# Patient Record
Sex: Male | Born: 1985 | Race: White | Hispanic: No | Marital: Married | Smoking: Former smoker
Health system: Southern US, Community
[De-identification: ages and names within clinical notes are randomized; demographics above are authoritative.]

## PROBLEM LIST (undated history)

## (undated) DIAGNOSIS — Q7649 Other congenital malformations of spine, not associated with scoliosis: Secondary | ICD-10-CM

## (undated) DIAGNOSIS — F1010 Alcohol abuse, uncomplicated: Secondary | ICD-10-CM

## (undated) DIAGNOSIS — F1910 Other psychoactive substance abuse, uncomplicated: Secondary | ICD-10-CM

## (undated) DIAGNOSIS — Z720 Tobacco use: Secondary | ICD-10-CM

## (undated) DIAGNOSIS — F419 Anxiety disorder, unspecified: Secondary | ICD-10-CM

## (undated) HISTORY — PX: BACK SURGERY: SHX140

---

## 2014-03-30 DIAGNOSIS — Q7649 Other congenital malformations of spine, not associated with scoliosis: Secondary | ICD-10-CM

## 2014-03-30 HISTORY — DX: Other congenital malformations of spine, not associated with scoliosis: Q76.49

## 2014-07-29 ENCOUNTER — Emergency Department (HOSPITAL_COMMUNITY): Payer: 59

## 2014-07-29 ENCOUNTER — Encounter (HOSPITAL_COMMUNITY): Payer: Self-pay

## 2014-07-29 ENCOUNTER — Emergency Department (HOSPITAL_COMMUNITY)
Admission: EM | Admit: 2014-07-29 | Discharge: 2014-07-29 | Disposition: A | Discharge status: Home / Self Care | Payer: 59 | Attending: Emergency Medicine | Admitting: Emergency Medicine

## 2014-07-29 DIAGNOSIS — Z720 Tobacco use: Secondary | ICD-10-CM | POA: Insufficient documentation

## 2014-07-29 DIAGNOSIS — Y939 Activity, unspecified: Secondary | ICD-10-CM | POA: Insufficient documentation

## 2014-07-29 DIAGNOSIS — F1210 Cannabis abuse, uncomplicated: Secondary | ICD-10-CM | POA: Insufficient documentation

## 2014-07-29 DIAGNOSIS — F1310 Sedative, hypnotic or anxiolytic abuse, uncomplicated: Secondary | ICD-10-CM | POA: Insufficient documentation

## 2014-07-29 DIAGNOSIS — F1110 Opioid abuse, uncomplicated: Secondary | ICD-10-CM | POA: Insufficient documentation

## 2014-07-29 DIAGNOSIS — Y998 Other external cause status: Secondary | ICD-10-CM | POA: Diagnosis not present

## 2014-07-29 DIAGNOSIS — Y929 Unspecified place or not applicable: Secondary | ICD-10-CM | POA: Diagnosis not present

## 2014-07-29 DIAGNOSIS — X58XXXA Exposure to other specified factors, initial encounter: Secondary | ICD-10-CM | POA: Insufficient documentation

## 2014-07-29 DIAGNOSIS — S299XXA Unspecified injury of thorax, initial encounter: Secondary | ICD-10-CM | POA: Diagnosis present

## 2014-07-29 DIAGNOSIS — S20212A Contusion of left front wall of thorax, initial encounter: Secondary | ICD-10-CM

## 2014-07-29 LAB — RAPID URINE DRUG SCREEN, HOSP PERFORMED
Amphetamines: NOT DETECTED
Barbiturates: NOT DETECTED
Benzodiazepines: POSITIVE — AB
Cocaine: NOT DETECTED
Opiates: POSITIVE — AB
TETRAHYDROCANNABINOL: POSITIVE — AB

## 2014-07-29 LAB — COMPREHENSIVE METABOLIC PANEL
ALBUMIN: 5.2 g/dL (ref 3.5–5.2)
ALT: 127 U/L — AB (ref 0–53)
AST: 329 U/L — ABNORMAL HIGH (ref 0–37)
Alkaline Phosphatase: 121 U/L — ABNORMAL HIGH (ref 39–117)
Anion gap: 14 (ref 5–15)
BUN: 9 mg/dL (ref 6–23)
CO2: 24 mmol/L (ref 19–32)
Calcium: 9.8 mg/dL (ref 8.4–10.5)
Chloride: 104 mmol/L (ref 96–112)
Creatinine, Ser: 1.05 mg/dL (ref 0.50–1.35)
GFR calc Af Amer: >90 mL/min (ref >90)
Glucose, Bld: 97 mg/dL (ref 70–99)
POTASSIUM: 4 mmol/L (ref 3.5–5.1)
Sodium: 142 mmol/L (ref 135–145)
TOTAL PROTEIN: 8.3 g/dL (ref 6.0–8.3)

## 2014-07-29 LAB — ETHANOL: ALCOHOL ETHYL (B): 163 mg/dL — AB (ref 0–9)

## 2014-07-29 LAB — I-STAT TROPONIN, ED: Troponin i, poc: 0 ng/mL (ref 0.00–0.08)

## 2014-07-29 MED ORDER — KETOROLAC TROMETHAMINE 15 MG/ML IJ SOLN
15.0000 mg | Freq: Once | INTRAMUSCULAR | Status: AC
  Administered 2014-07-29: 15 mg via INTRAVENOUS
  Filled 2014-07-29: qty 1

## 2014-07-29 MED ORDER — ACETAMINOPHEN 325 MG PO TABS
650.0000 mg | ORAL_TABLET | Freq: Once | ORAL | Status: DC
  Filled 2014-07-29: qty 2

## 2014-07-29 MED ORDER — ONDANSETRON 8 MG PO TBDP
8.0000 mg | ORAL_TABLET | Freq: Once | ORAL | Status: AC
  Administered 2014-07-29: 8 mg via ORAL
  Filled 2014-07-29: qty 1

## 2014-07-29 NOTE — ED Provider Notes (Signed)
CSN: [id number]     Arrival date & time 07/29/14  1423 History   First MD Initiated Contact with Patient 07/29/14 1501     Chief Complaint  Patient presents with  . Insect Bite  . Tachycardia     (Consider location/radiation/quality/duration/timing/severity/associated sxs/prior Treatment) HPI aWakened this morning with a bruised area over his left anteriolateral chest which she attributes to a spider bite.. He has no recall of being bitten by anything. He complains of pain over left anterior chest covering presently 5 cm diameter area. Fourth with changing position. Accompanying symptoms include nausea and blurred vision vision is improving steadily with time. He treated himself with ibuprofen and "a shot" of alcohol for the pain. No other associated symptoms. No fever. History reviewed. No pertinent past medical history. Past Surgical History  Procedure Laterality Date  . Back surgery     depression, anxiety History reviewed. No pertinent family history. History  Substance Use Topics  . Smoking status: Current Every Day Smoker -- 0.50 packs/day    Types: Cigarettes  . Smokeless tobacco: Not on file  . Alcohol Use: Yes    denies  illicitdrug use Review of Systems  Eyes: Positive for visual disturbance.  Cardiovascular: Positive for chest pain.  Gastrointestinal: Positive for nausea.  Skin: Positive for wound.  All other systems reviewed and are negative.     Allergies  Review of patient's allergies indicates not on file.  Home Medications   Prior to Admission medications   Not on File   BP 140/96 mmHg  Pulse 113  Temp(Src) 98 F (36.7 C) (Oral)  Resp 20  Ht  (1.778 m)  Wt 150 lb (68.04 kg)  BMI 21.52 kg/m2  SpO2 98% Physical Exam  Constitutional: He is oriented to person, place, and time. He appears well-developed and well-nourished.  HENT:  Head: Normocephalic and atraumatic.  Eyes: Conjunctivae are normal. Pupils are equal, round, and reactive to  light.  Neck: Neck supple. No tracheal deviation present. No thyromegaly present.  Cardiovascular: Regular rhythm.   No murmur heard. Mildly tachycardic  Pulmonary/Chest: Effort normal and breath sounds normal.  Is a 5 cm purplish ecchymosis at left anterolateral chest with corresponding tenderness. No mass  Abdominal: Soft. Bowel sounds are normal. He exhibits no distension. There is no tenderness.  Musculoskeletal: Normal range of motion. He exhibits no edema or tenderness.  Neurological: He is alert and oriented to person, place, and time. No cranial nerve deficit. Coordination normal.  Skin: Skin is warm and dry. No rash noted.  Psychiatric: He has a normal mood and affect.  Nursing note and vitals reviewed.   ED Course  Procedures (including critical care time) Labs Review Labs Reviewed - No data to display  Imaging Review No results found.   EKG Interpretation   Date/Time:  Thursday July 29 2014 14:47:23 EDT Ventricular Rate:  110 PR Interval:  167 QRS Duration: 95 QT Interval:  328 QTC Calculation: 444 R Axis:   59 Text Interpretation:  Sinus tachycardia no old Confirmed by KOHUT  MD,  STEPHEN ([id number]) on 07/29/2014 2:52:44 PM     5:05 PM Wound is unchanged Patient requesting more pain medicine. Tylenol ordered. Patients wife arrived. On interviewing her she states that he did not have bruises on chest upon going to bed last night. Results for orders placed or performed during the hospital encounter of 07/29/14  Ethanol  Result Value Ref Range   Alcohol, Ethyl (B) 163 (H) 0 - 9 mg/dL  Comprehensive metabolic panel  Result Value Ref Range   Sodium 142 135 - 145 mmol/L   Potassium 4.0 3.5 - 5.1 mmol/L   Chloride 104 96 - 112 mmol/L   CO2 24 19 - 32 mmol/L   Glucose, Bld 97 70 - 99 mg/dL   BUN 9 6 - 23 mg/dL   Creatinine, Ser 1.611.05 0.50 - 1.35 mg/dL   Calcium 9.8 8.4 - 09.610.5 mg/dL   Total Protein 8.3 6.0 - 8.3 g/dL   Albumin 5.2 3.5 - 5.2 g/dL   AST 045329 (H) 0 -  37 U/L   ALT 127 (H) 0 - 53 U/L   Alkaline Phosphatase 121 (H) 39 - 117 U/L   Total Bilirubin <0.1 (L) 0.3 - 1.2 mg/dL   GFR calc non Af Amer >90 >90 mL/min   GFR calc Af Amer >90 >90 mL/min   Anion gap 14 5 - 15  Drug screen panel, emergency  Result Value Ref Range   Opiates POSITIVE (A) NONE DETECTED   Cocaine NONE DETECTED NONE DETECTED   Benzodiazepines POSITIVE (A) NONE DETECTED   Amphetamines NONE DETECTED NONE DETECTED   Tetrahydrocannabinol POSITIVE (A) NONE DETECTED   Barbiturates NONE DETECTED NONE DETECTED  I-stat troponin, ED  Result Value Ref Range   Troponin i, poc 0.00 0.00 - 0.08 ng/mL   Comment 3           Dg Chest 2 View  07/29/2014   CLINICAL DATA:  Insect bite to the right side of the ribcage. Blurry vision started this morning. Ten out of 10 pain.  EXAM: CHEST  2 VIEW  COMPARISON:  None.  FINDINGS: The heart size and mediastinal contours are within normal limits. Both lungs are clear. The visualized skeletal structures are unremarkable.  IMPRESSION: No active cardiopulmonary disease.   Electronically Signed   By: Elige KoHetal  Patel   On: 07/29/2014 16:12    MDM  No signs of infection. Chest x-ray viewed by me Final diagnoses:  None   plan Tylenol for pain. Possibility of insect bite exists however unclear history   watch for signs of infection. Redness fever drainage Diagnosis#1 contusion chest wall #2 alcohol intoxication #3 substance abuse     Doug SouSam Bralynn Velador, MD 07/29/14 [phone]

## 2014-07-29 NOTE — Discharge Instructions (Signed)
Take Tylenol as needed for discomfort. Signs of infection are redness drainage from the wound and warmth at the site of the wound and fever. You have no evidence of infection today. Return or see an urgent care center if you feel worse. Your blood alcohol today is 2 times the legal limit. You also have several drugs in your system including opioids, benzodiazepines and marijuana. If you have a substance abuse problem get help, call any of the numbers on the resource guide. Call the [facility]  and wellness Center to get a primary care physician.  Emergency Department Resource Guide 1) Find a Doctor and Pay Out of Pocket Although you won't have to find out who is covered by your insurance plan, it is a good idea to ask around and get recommendations. You will then need to call the office and see if the doctor you have chosen will accept you as a new patient and what types of options they offer for patients who are self-pay. Some doctors offer discounts or will set up payment plans for their patients who do not have insurance, but you will need to ask so you aren't surprised when you get to your appointment.  2) Contact Your Local Health Department Not all health departments have doctors that can see patients for sick visits, but many do, so it is worth a call to see if yours does. If you don't know where your local health department is, you can check in your phone book. The CDC also has a tool to help you locate your state's health department, and many state websites also have listings of all of their local health departments.  3) Find a Walk-in Clinic If your illness is not likely to be very severe or complicated, you may want to try a walk in clinic. These are popping up all over the country in pharmacies, drugstores, and shopping centers. They're usually staffed by nurse practitioners or physician assistants that have been trained to treat common illnesses and complaints. They're usually fairly quick  and inexpensive. However, if you have serious medical issues or chronic medical problems, these are probably not your best option.  No Primary Care Doctor: - Call Health Connect at  [phone] - they can help you locate a primary care doctor that  accepts your insurance, provides certain services, etc. - Physician Referral Service- [phone]  Chronic Pain Problems: Organization         Address  Phone   Notes  [facility]  [phone] Patients need to be referred by their primary care doctor.   Medication Assistance: Organization         Address  Phone   Notes  [facility] Medication [facility] [address] [place] [place], [place] [address] [phone] --Must be a resident of [facility] -- Must have NO insurance coverage whatsoever (no Medicaid/ Medicare, etc.) -- The pt. MUST have a primary care doctor that directs their care regularly and follows them in the community   [facility]  [phone]   [employer]  [phone]    Agencies that provide inexpensive medical care: Organization         Address  Phone   Notes  [facility]  [phone]   [facility]    [phone]   [facility] [address] [place], [place] [address] [phone]   [facility] [address] [place] [place] (  [phone]   Planned Parenthood    [phone]   [facility]    [phone]   Community [facility] and [facility]  [address], [place] Phone:  [phone], Fax:  [phone] Hours of Operation:  9 am - 6 pm, M-F.  Also accepts Medicaid/Medicare and self-pay.  [facility]  [address], [place] Phone: [phone], Fax: [phone]. Hours of Operation:  8:30 am - 5:30 pm, M-F.  Also accepts Medicaid and self-pay.  [facility] [place] [address], [place] Phone: [phone]    [facility] [address] Natasha Bence [place], [place] [phone], Ext. [phone] Mondays & Thursdays: 7-9 AM.  First 15 patients are seen on a first come, first serve basis.    Medicaid-accepting [facility] Providers:  Organization         Address  Phone   Notes  [facility] [address], [place] [phone] Also accepts self-pay patients.  [facility] [address] Laurell Josephs [place], [place]  [phone]   [facility] [address], [place] [phone]   [facility] [address] [place] [phone]   Renaye Rakers [address], [place]   [phone] Only accepts [place] patients after they have their name applied to their card.   Self-Pay (no insurance) in [facility]:  Organization         Address  Phone   Notes  Sickle Cell Patients, [facility] Medicine [address] [place], [place] [phone]   [facility] [address] [place], [place] [phone]   [facility] [place]  [address], [place] [phone]   [facility]/Dr. Osei-Bonsu  [address] [place] or [address], [place] [phone] Phone number for both [place] and [place] locations is the same.  [facility] [address], [place] [phone]   [facility] [address] [place] or [address] [phone]   [facility] [address], [place] [phone], phone; [phone], fax Sees patients 1st and 3rd Saturday of every month.  Must not qualify for public or private insurance (i.e. Medicaid, Medicare, [place] Health Choice, Veterans' Benefits)  Household income should be no more than 200% of the poverty level The clinic cannot treat you if you are  pregnant or think you are pregnant  Sexually transmitted diseases are not treated at the clinic.    Dental Care: Organization         Address  Phone  Notes  [facility] Department of [facility] [address] [place], [place] [phone] Accepts children up to age 67 who are enrolled in [place] or [place] Health Choice; pregnant women with a Medicaid card; and children who have applied for Medicaid or [place] Health Choice, but were declined, whose parents can pay a reduced fee at time of service.  [facility] Department of [facility]  [address], [place] [phone] Accepts children up to age 11 who are enrolled in [place] or [place] Health Choice; pregnant women with a Medicaid card; and children who have applied for Medicaid or  Health  Choice, but were declined, whose parents can pay a reduced fee at time of service.  [facility]  [address] [place], [place] [phone] Patients are seen by appointment only. Walk-ins are not accepted. [facility] will see patients 68 years of age and older. Monday - Tuesday (8am-5pm) Most Wednesdays (8:30-5pm) $30 per visit, cash only  [facility] Adult [facility]  [address], [facility] [phone] Patients are seen by appointment only. Walk-ins are not accepted. [facility] will see patients 63 years of age and older. One Wednesday Evening (Monthly: Volunteer Based).  $30 per visit, cash only  [employer]  [phone] for adults; Children under age 51, call Graduate Pediatric Dentistry at [phone]. Children aged 76-14, please call [phone] to request a pediatric application.  Dental services are provided in all areas of dental care including fillings, crowns and bridges, complete and partial dentures, implants, gum treatment, root canals, and extractions. Preventive care is also provided. Treatment is provided to  both adults and children. Patients are selected via a lottery and there is often a waiting list.   [facility] [address] [place]  [phone] [email or web address]   [facility] [address] [place], [place] [phone], Ext. [phone] Second and Fourth Thursday of each month, opens at 6:30 AM; Clinic ends at 9 AM.  Patients are seen on a first-come first-served basis, and a limited number are seen during each clinic.   [facility]  [address] [place], [place] [phone]   Eligibility Requirements You must have lived in [place], [place], or [place] counties for at least the last three months.   You cannot be eligible for state or federal sponsored [place], including [employer], [place], or [employer].   You generally cannot be eligible for healthcare insurance through your employer.    How to apply: Eligibility screenings are held every Tuesday and Wednesday afternoon from 1:00 pm until 4:00 pm. You do not need an appointment for the interview!  [facility] [address], [place], [place] [phone]   [facility] Health Department  [phone]   [facility] Health Department  [phone]   [facility] Health Department  [phone]    Behavioral Health Resources in the Community: Intensive Outpatient Programs Organization         Address  Phone  Notes  [facility] [address], [place], [place] [phone]   [facility] Outpatient [address], [place], [place] [phone]   [facility] [address], [place], [place]  [phone]   [facility] Mental Health [address]  [place], [place] [phone] or [phone]   Substance Abuse Resources Organization         Address  Phone  Notes  [facility] Drug Services  [phone]   [facility]  [phone]   [facility] [place]   [phone]   Floydene Flock  [phone]   Residential & Outpatient Substance Abuse Program  [phone]   Psychological Services Organization         Address  Phone  Notes  [facility]  [phone]   [facility]  [phone]   [facility] Mental Health [address] [place] [phone] or [phone]    Mobile Crisis Teams Organization         Address  Phone  Notes  [facility], Mobile Crisis Care Unit  [phone]   [facility]  [address] [place], [place] [phone]   [facility] [address] [place] [phone]    Self-Help/Support Groups Organization         Address  Phone             Notes  [facility] [place] - variety of support groups  [phone]- [phone] Call for more information  Narcotics Anonymous (NA), [facility] [address], [place]  2 meetings at this location   Statisticianesidential Treatment Programs Organization         Address  Phone  Notes  [facility] [address],    [place]  [phone]   [facility]  [address], [place] [phone], [place], [place] [phone]   [facility] [address], [place] [phone] Admissions: 8am-3pm M-F  [facility] [address]    [place], [place] [phone]   [facility] [address], [place], [place] [phone]   [facility] [address]  [place], [place] [phone]   [facility] - Intensive Outpatient [address], [place], [place] [phone]   [facility] ([facility] [address]  [place], [place] [phone] or [phone]   [facility] ([facility]) [address] [place], [place] [phone] Accepts Medicaid  [facility] [address]  [place] [phone] Substance Abuse/Addiction Treatment   [facility] Behavioral Health Resources Organization         Address  Phone  Notes  [facility]  [phone]) [phone]   Angie FavaJulie Brannon, PhD [address] [place], [place]   [phone]) [phone] or [phone]) [phone]   [facility]   [address] [place], [place] [phone]) [phone]   [facility] [address], [place], [place] [phone](336) [phone] Insurance/Medicaid/sponsorship through [facility]  Faith and Families [address]                                    [place], [place] [phone](336) [phone] Therapy/tele-psych/case  [facility] [address].   [place], [place] [phone](336) [phone]    Dr. Lolly MustacheArfeen  [phone](336) [phone]   [facility] [place]  [employer] [facility] 1) [address], [place] 2) [address], [place] 3)  [address], [place] [phone]) [phone]) [phone]  [phone](336) [phone]   [facility] Child Abuse Hotline [phone](336) (715)[phone] or [phone]) [phone] (After Hours)

## 2014-07-29 NOTE — Progress Notes (Signed)
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 He states he has been awaiting pain medication  CM informed ED RN pt requesting pain medication

## 2014-07-29 NOTE — ED Notes (Signed)
Pt states that he needs an eye exam he use to wear glasses as a child and uses over the counter reading glasses to help him see

## 2014-07-29 NOTE — ED Notes (Signed)
Pt c/o insect bite to R side ribcage, nausea, and blurred vision starting this morning.  Pain score 10/10.  Swelling and bruising noted.  Pt reports that he woke up w/ the bite.

## 2014-09-01 ENCOUNTER — Emergency Department (HOSPITAL_COMMUNITY): Payer: 59

## 2014-09-01 ENCOUNTER — Encounter (HOSPITAL_COMMUNITY)
Admission: EM | Disposition: A | Discharge status: Home / Self Care | Payer: Self-pay | Source: Home / Self Care | Attending: Internal Medicine

## 2014-09-01 ENCOUNTER — Encounter (HOSPITAL_COMMUNITY): Payer: Self-pay

## 2014-09-01 ENCOUNTER — Inpatient Hospital Stay (HOSPITAL_COMMUNITY)
Admission: EM | Admit: 2014-09-01 | Discharge: 2014-09-04 | DRG: 378 | Disposition: A | Discharge status: Home / Self Care | Payer: 59 | Attending: Internal Medicine | Admitting: Internal Medicine

## 2014-09-01 DIAGNOSIS — K920 Hematemesis: Secondary | ICD-10-CM

## 2014-09-01 DIAGNOSIS — M5440 Lumbago with sciatica, unspecified side: Secondary | ICD-10-CM | POA: Diagnosis not present

## 2014-09-01 DIAGNOSIS — M549 Dorsalgia, unspecified: Secondary | ICD-10-CM | POA: Diagnosis present

## 2014-09-01 DIAGNOSIS — I959 Hypotension, unspecified: Secondary | ICD-10-CM | POA: Diagnosis present

## 2014-09-01 DIAGNOSIS — F1910 Other psychoactive substance abuse, uncomplicated: Secondary | ICD-10-CM | POA: Diagnosis not present

## 2014-09-01 DIAGNOSIS — Z79899 Other long term (current) drug therapy: Secondary | ICD-10-CM | POA: Diagnosis not present

## 2014-09-01 DIAGNOSIS — R609 Edema, unspecified: Secondary | ICD-10-CM | POA: Diagnosis present

## 2014-09-01 DIAGNOSIS — F17210 Nicotine dependence, cigarettes, uncomplicated: Secondary | ICD-10-CM | POA: Diagnosis present

## 2014-09-01 DIAGNOSIS — Z791 Long term (current) use of non-steroidal anti-inflammatories (NSAID): Secondary | ICD-10-CM | POA: Diagnosis not present

## 2014-09-01 DIAGNOSIS — F1193 Opioid use, unspecified with withdrawal: Secondary | ICD-10-CM

## 2014-09-01 DIAGNOSIS — K922 Gastrointestinal hemorrhage, unspecified: Secondary | ICD-10-CM | POA: Diagnosis present

## 2014-09-01 DIAGNOSIS — K254 Chronic or unspecified gastric ulcer with hemorrhage: Secondary | ICD-10-CM | POA: Diagnosis present

## 2014-09-01 DIAGNOSIS — D62 Acute posthemorrhagic anemia: Secondary | ICD-10-CM | POA: Diagnosis present

## 2014-09-01 DIAGNOSIS — F419 Anxiety disorder, unspecified: Secondary | ICD-10-CM | POA: Diagnosis present

## 2014-09-01 DIAGNOSIS — Z720 Tobacco use: Secondary | ICD-10-CM | POA: Diagnosis not present

## 2014-09-01 DIAGNOSIS — Z79891 Long term (current) use of opiate analgesic: Secondary | ICD-10-CM | POA: Diagnosis not present

## 2014-09-01 DIAGNOSIS — F1010 Alcohol abuse, uncomplicated: Secondary | ICD-10-CM | POA: Diagnosis present

## 2014-09-01 DIAGNOSIS — Z8249 Family history of ischemic heart disease and other diseases of the circulatory system: Secondary | ICD-10-CM

## 2014-09-01 DIAGNOSIS — F1123 Opioid dependence with withdrawal: Secondary | ICD-10-CM

## 2014-09-01 DIAGNOSIS — G8929 Other chronic pain: Secondary | ICD-10-CM | POA: Diagnosis present

## 2014-09-01 HISTORY — PX: ESOPHAGOGASTRODUODENOSCOPY: SHX5428

## 2014-09-01 HISTORY — DX: Other congenital malformations of spine, not associated with scoliosis: Q76.49

## 2014-09-01 HISTORY — DX: Other psychoactive substance abuse, uncomplicated: F19.10

## 2014-09-01 HISTORY — DX: Anxiety disorder, unspecified: F41.9

## 2014-09-01 HISTORY — DX: Alcohol abuse, uncomplicated: F10.10

## 2014-09-01 HISTORY — DX: Tobacco use: Z72.0

## 2014-09-01 LAB — I-STAT CHEM 8, ED
BUN: 42 mg/dL — ABNORMAL HIGH (ref 6–20)
CALCIUM ION: 1.22 mmol/L (ref 1.12–1.23)
CHLORIDE: 107 mmol/L (ref 101–111)
Creatinine, Ser: 1.1 mg/dL (ref 0.61–1.24)
Glucose, Bld: 193 mg/dL — ABNORMAL HIGH (ref 70–99)
HEMATOCRIT: 38 % — AB (ref 39.0–52.0)
Hemoglobin: 12.9 g/dL — ABNORMAL LOW (ref 13.0–17.0)
Potassium: 4.6 mmol/L (ref 3.5–5.1)
SODIUM: 141 mmol/L (ref 135–145)
TCO2: 19 mmol/L (ref 0–100)

## 2014-09-01 LAB — CBC
HCT: 24.8 % — ABNORMAL LOW (ref 39.0–52.0)
HCT: 24.9 % — ABNORMAL LOW (ref 39.0–52.0)
HEMATOCRIT: 30.5 % — AB (ref 39.0–52.0)
HEMATOCRIT: 29.3 % — AB (ref 39.0–52.0)
HEMOGLOBIN: 10.1 g/dL — AB (ref 13.0–17.0)
HEMOGLOBIN: 8.3 g/dL — AB (ref 13.0–17.0)
HEMOGLOBIN: 8.2 g/dL — AB (ref 13.0–17.0)
Hemoglobin: 9.9 g/dL — ABNORMAL LOW (ref 13.0–17.0)
MCH: 34.5 pg — AB (ref 26.0–34.0)
MCH: 35.1 pg — ABNORMAL HIGH (ref 26.0–34.0)
MCH: 34.9 pg — AB (ref 26.0–34.0)
MCH: 34.5 pg — ABNORMAL HIGH (ref 26.0–34.0)
MCHC: 33.1 g/dL (ref 30.0–36.0)
MCHC: 33.8 g/dL (ref 30.0–36.0)
MCHC: 33.5 g/dL (ref 30.0–36.0)
MCHC: 32.9 g/dL (ref 30.0–36.0)
MCV: 104.1 fL — AB (ref 78.0–100.0)
MCV: 103.9 fL — ABNORMAL HIGH (ref 78.0–100.0)
MCV: 104.2 fL — AB (ref 78.0–100.0)
MCV: 104.6 fL — ABNORMAL HIGH (ref 78.0–100.0)
PLATELETS: 144 10*3/uL — AB (ref 150–400)
Platelets: 184 10*3/uL (ref 150–400)
Platelets: 157 10*3/uL (ref 150–400)
Platelets: 133 10*3/uL — ABNORMAL LOW (ref 150–400)
RBC: 2.93 MIL/uL — ABNORMAL LOW (ref 4.22–5.81)
RBC: 2.82 MIL/uL — AB (ref 4.22–5.81)
RBC: 2.38 MIL/uL — AB (ref 4.22–5.81)
RBC: 2.38 MIL/uL — AB (ref 4.22–5.81)
RDW: 11.4 % — ABNORMAL LOW (ref 11.5–15.5)
RDW: 11.5 % (ref 11.5–15.5)
RDW: 11.6 % (ref 11.5–15.5)
RDW: 11.5 % (ref 11.5–15.5)
WBC: 7.9 10*3/uL (ref 4.0–10.5)
WBC: 7.1 10*3/uL (ref 4.0–10.5)
WBC: 4.8 10*3/uL (ref 4.0–10.5)
WBC: 5 10*3/uL (ref 4.0–10.5)

## 2014-09-01 LAB — CBC WITH DIFFERENTIAL/PLATELET
BASOS PCT: 0 % (ref 0–1)
Basophils Absolute: 0 10*3/uL (ref 0.0–0.1)
Eosinophils Absolute: 0 10*3/uL (ref 0.0–0.7)
Eosinophils Relative: 0 % (ref 0–5)
HEMATOCRIT: 36 % — AB (ref 39.0–52.0)
HEMOGLOBIN: 12.1 g/dL — AB (ref 13.0–17.0)
LYMPHS PCT: 14 % (ref 12–46)
Lymphs Abs: 1.4 10*3/uL (ref 0.7–4.0)
MCH: 35.1 pg — ABNORMAL HIGH (ref 26.0–34.0)
MCHC: 33.6 g/dL (ref 30.0–36.0)
MCV: 104.3 fL — ABNORMAL HIGH (ref 78.0–100.0)
Monocytes Absolute: 0.8 10*3/uL (ref 0.1–1.0)
Monocytes Relative: 8 % (ref 3–12)
NEUTROS PCT: 78 % — AB (ref 43–77)
Neutro Abs: 8 10*3/uL — ABNORMAL HIGH (ref 1.7–7.7)
PLATELETS: 250 10*3/uL (ref 150–400)
RBC: 3.45 MIL/uL — ABNORMAL LOW (ref 4.22–5.81)
RDW: 11.6 % (ref 11.5–15.5)
WBC: 10.2 10*3/uL (ref 4.0–10.5)

## 2014-09-01 LAB — COMPREHENSIVE METABOLIC PANEL
ALBUMIN: 3.7 g/dL (ref 3.5–5.0)
ALT: 52 U/L (ref 17–63)
ALT: 43 U/L (ref 17–63)
AST: 62 U/L — ABNORMAL HIGH (ref 15–41)
AST: 48 U/L — AB (ref 15–41)
Albumin: 4.3 g/dL (ref 3.5–5.0)
Alkaline Phosphatase: 70 U/L (ref 38–126)
Alkaline Phosphatase: 53 U/L (ref 38–126)
Anion gap: 7 (ref 5–15)
Anion gap: 5 (ref 5–15)
BUN: 45 mg/dL — ABNORMAL HIGH (ref 6–20)
BUN: 41 mg/dL — AB (ref 6–20)
CALCIUM: 9.1 mg/dL (ref 8.9–10.3)
CHLORIDE: 116 mmol/L — AB (ref 101–111)
CO2: 23 mmol/L (ref 22–32)
CO2: 21 mmol/L — AB (ref 22–32)
CREATININE: 1.12 mg/dL (ref 0.61–1.24)
Calcium: 7.9 mg/dL — ABNORMAL LOW (ref 8.9–10.3)
Chloride: 111 mmol/L (ref 101–111)
Creatinine, Ser: 0.83 mg/dL (ref 0.61–1.24)
GFR calc non Af Amer: >60 mL/min (ref >60)
GFR calc non Af Amer: >60 mL/min (ref >60)
Glucose, Bld: 200 mg/dL — ABNORMAL HIGH (ref 70–99)
Glucose, Bld: 132 mg/dL — ABNORMAL HIGH (ref 70–99)
POTASSIUM: 4.7 mmol/L (ref 3.5–5.1)
Potassium: 4.7 mmol/L (ref 3.5–5.1)
Sodium: 141 mmol/L (ref 135–145)
Sodium: 142 mmol/L (ref 135–145)
Total Bilirubin: 1.3 mg/dL — ABNORMAL HIGH (ref 0.3–1.2)
Total Bilirubin: 0.9 mg/dL (ref 0.3–1.2)
Total Protein: 6.6 g/dL (ref 6.5–8.1)
Total Protein: 5.5 g/dL — ABNORMAL LOW (ref 6.5–8.1)

## 2014-09-01 LAB — GLUCOSE, CAPILLARY: Glucose-Capillary: 123 mg/dL — ABNORMAL HIGH (ref 70–99)

## 2014-09-01 LAB — URINALYSIS, ROUTINE W REFLEX MICROSCOPIC
Bilirubin Urine: NEGATIVE
Glucose, UA: NEGATIVE mg/dL
Hgb urine dipstick: NEGATIVE
LEUKOCYTES UA: NEGATIVE
Nitrite: NEGATIVE
PROTEIN: 30 mg/dL — AB
Specific Gravity, Urine: 1.023 (ref 1.005–1.030)
Urobilinogen, UA: 0.2 mg/dL (ref 0.0–1.0)
pH: 6.5 (ref 5.0–8.0)

## 2014-09-01 LAB — ABO/RH: ABO/RH(D): O POS

## 2014-09-01 LAB — URINE MICROSCOPIC-ADD ON

## 2014-09-01 LAB — LIPASE, BLOOD: Lipase: 16 U/L — ABNORMAL LOW (ref 22–51)

## 2014-09-01 LAB — RAPID URINE DRUG SCREEN, HOSP PERFORMED
Amphetamines: NOT DETECTED
Barbiturates: NOT DETECTED
Benzodiazepines: POSITIVE — AB
Cocaine: NOT DETECTED
Opiates: POSITIVE — AB
Tetrahydrocannabinol: POSITIVE — AB

## 2014-09-01 LAB — I-STAT CG4 LACTIC ACID, ED: Lactic Acid, Venous: 2.87 mmol/L (ref 0.5–2.0)

## 2014-09-01 LAB — TROPONIN I
TROPONIN I: 0.06 ng/mL — AB (ref <0.031)
Troponin I: 0.07 ng/mL — ABNORMAL HIGH (ref <0.031)

## 2014-09-01 LAB — PREPARE RBC (CROSSMATCH)

## 2014-09-01 LAB — PROTIME-INR
INR: 1.07 (ref 0.00–1.49)
Prothrombin Time: 14 seconds (ref 11.6–15.2)

## 2014-09-01 LAB — MRSA PCR SCREENING: MRSA BY PCR: NEGATIVE

## 2014-09-01 LAB — HIV ANTIBODY (ROUTINE TESTING W REFLEX): HIV SCREEN 4TH GENERATION: NONREACTIVE

## 2014-09-01 LAB — LACTIC ACID, PLASMA: Lactic Acid, Venous: 0.6 mmol/L (ref 0.5–2.0)

## 2014-09-01 LAB — APTT: aPTT: 24 seconds (ref 24–37)

## 2014-09-01 LAB — ETHANOL: Alcohol, Ethyl (B): <5 mg/dL (ref <5)

## 2014-09-01 SURGERY — EGD (ESOPHAGOGASTRODUODENOSCOPY)
Anesthesia: Moderate Sedation

## 2014-09-01 MED ORDER — SODIUM CHLORIDE 0.9 % IJ SOLN
3.0000 mL | Freq: Two times a day (BID) | INTRAMUSCULAR | Status: DC
  Administered 2014-09-01 – 2014-09-02 (×3): 3 mL via INTRAVENOUS

## 2014-09-01 MED ORDER — SODIUM CHLORIDE 0.9 % IV SOLN
8.0000 mg/h | INTRAVENOUS | Status: DC
  Administered 2014-09-01 – 2014-09-03 (×5): 8 mg/h via INTRAVENOUS
  Filled 2014-09-01 (×12): qty 80

## 2014-09-01 MED ORDER — SODIUM CHLORIDE 0.9 % IJ SOLN
PREFILLED_SYRINGE | INTRAMUSCULAR | Status: DC | PRN
  Administered 2014-09-01: 2 mL

## 2014-09-01 MED ORDER — FOLIC ACID 1 MG PO TABS
1.0000 mg | ORAL_TABLET | Freq: Every day | ORAL | Status: DC
  Administered 2014-09-01 – 2014-09-04 (×4): 1 mg via ORAL
  Filled 2014-09-01 (×4): qty 1

## 2014-09-01 MED ORDER — SODIUM CHLORIDE 0.9 % IV SOLN
50.0000 ug/h | INTRAVENOUS | Status: DC
  Administered 2014-09-01 – 2014-09-02 (×4): 50 ug/h via INTRAVENOUS
  Filled 2014-09-01 (×8): qty 1

## 2014-09-01 MED ORDER — LORAZEPAM 2 MG/ML IJ SOLN
1.0000 mg | Freq: Four times a day (QID) | INTRAMUSCULAR | Status: AC | PRN
  Administered 2014-09-01 – 2014-09-03 (×4): 1 mg via INTRAVENOUS
  Filled 2014-09-01 (×5): qty 1

## 2014-09-01 MED ORDER — FENTANYL CITRATE (PF) 100 MCG/2ML IJ SOLN
INTRAMUSCULAR | Status: DC | PRN
  Administered 2014-09-01 (×4): 25 ug via INTRAVENOUS

## 2014-09-01 MED ORDER — PANTOPRAZOLE SODIUM 40 MG IV SOLR
40.0000 mg | Freq: Once | INTRAVENOUS | Status: AC
  Administered 2014-09-01: 40 mg via INTRAVENOUS
  Filled 2014-09-01: qty 40

## 2014-09-01 MED ORDER — SODIUM CHLORIDE 0.9 % IV BOLUS (SEPSIS)
2000.0000 mL | Freq: Once | INTRAVENOUS | Status: AC
  Administered 2014-09-01: 2000 mL via INTRAVENOUS

## 2014-09-01 MED ORDER — SODIUM CHLORIDE 0.9 % IV SOLN
INTRAVENOUS | Status: DC
  Administered 2014-09-01: 04:00:00 via INTRAVENOUS

## 2014-09-01 MED ORDER — LORAZEPAM 2 MG/ML IJ SOLN
0.5000 mg | Freq: Once | INTRAMUSCULAR | Status: AC
  Administered 2014-09-01: 0.5 mg via INTRAVENOUS
  Filled 2014-09-01: qty 1

## 2014-09-01 MED ORDER — ONDANSETRON HCL 4 MG/2ML IJ SOLN
4.0000 mg | Freq: Once | INTRAMUSCULAR | Status: AC
  Administered 2014-09-01: 4 mg via INTRAVENOUS
  Filled 2014-09-01: qty 2

## 2014-09-01 MED ORDER — DIPHENHYDRAMINE HCL 50 MG/ML IJ SOLN
INTRAMUSCULAR | Status: DC | PRN
  Administered 2014-09-01 (×2): 25 mg via INTRAVENOUS

## 2014-09-01 MED ORDER — VENLAFAXINE HCL ER 75 MG PO CP24
75.0000 mg | ORAL_CAPSULE | Freq: Every day | ORAL | Status: DC
  Administered 2014-09-02 – 2014-09-04 (×3): 75 mg via ORAL
  Filled 2014-09-01 (×5): qty 1

## 2014-09-01 MED ORDER — MIDAZOLAM HCL 10 MG/2ML IJ SOLN
INTRAMUSCULAR | Status: DC | PRN
  Administered 2014-09-01 (×5): 2 mg via INTRAVENOUS

## 2014-09-01 MED ORDER — SODIUM CHLORIDE 0.9 % IV SOLN: 10.0000 mL/h | Freq: Once | INTRAVENOUS | Status: DC

## 2014-09-01 MED ORDER — ADULT MULTIVITAMIN W/MINERALS CH
1.0000 | ORAL_TABLET | Freq: Every day | ORAL | Status: DC
  Administered 2014-09-01 – 2014-09-04 (×4): 1 via ORAL
  Filled 2014-09-01 (×4): qty 1

## 2014-09-01 MED ORDER — ONDANSETRON HCL 4 MG/2ML IJ SOLN
4.0000 mg | Freq: Three times a day (TID) | INTRAMUSCULAR | Status: DC | PRN
  Administered 2014-09-02 – 2014-09-03 (×2): 4 mg via INTRAVENOUS
  Filled 2014-09-01 (×2): qty 2

## 2014-09-01 MED ORDER — SODIUM CHLORIDE 0.9 % IV SOLN
80.0000 mg | Freq: Once | INTRAVENOUS | Status: DC
  Filled 2014-09-01: qty 80

## 2014-09-01 MED ORDER — MORPHINE SULFATE 2 MG/ML IJ SOLN
2.0000 mg | INTRAMUSCULAR | Status: DC | PRN
  Administered 2014-09-01 – 2014-09-04 (×17): 2 mg via INTRAVENOUS
  Filled 2014-09-01 (×17): qty 1

## 2014-09-01 MED ORDER — BUTAMBEN-TETRACAINE-BENZOCAINE 2-2-14 % EX AERO
INHALATION_SPRAY | CUTANEOUS | Status: DC | PRN
  Administered 2014-09-01: 2 via TOPICAL

## 2014-09-01 MED ORDER — EPINEPHRINE HCL 0.1 MG/ML IJ SOSY
PREFILLED_SYRINGE | INTRAMUSCULAR | Status: AC
  Filled 2014-09-01: qty 10

## 2014-09-01 MED ORDER — MIDAZOLAM HCL 10 MG/2ML IJ SOLN
INTRAMUSCULAR | Status: AC
  Filled 2014-09-01: qty 2

## 2014-09-01 MED ORDER — LORAZEPAM 2 MG/ML IJ SOLN
0.0000 mg | Freq: Two times a day (BID) | INTRAMUSCULAR | Status: DC
Start: 2014-09-03 — End: 2014-09-04
  Administered 2014-09-03 – 2014-09-04 (×2): 1 mg via INTRAVENOUS
  Filled 2014-09-01 (×2): qty 1

## 2014-09-01 MED ORDER — VENLAFAXINE HCL 75 MG PO TABS
75.0000 mg | ORAL_TABLET | Freq: Every day | ORAL | Status: DC
  Administered 2014-09-01: 75 mg via ORAL
  Filled 2014-09-01 (×2): qty 1

## 2014-09-01 MED ORDER — FENTANYL CITRATE (PF) 100 MCG/2ML IJ SOLN
INTRAMUSCULAR | Status: AC
  Filled 2014-09-01: qty 2

## 2014-09-01 MED ORDER — SODIUM CHLORIDE 0.9 % IV SOLN: INTRAVENOUS | Status: DC

## 2014-09-01 MED ORDER — VITAMIN B-1 100 MG PO TABS
100.0000 mg | ORAL_TABLET | Freq: Every day | ORAL | Status: DC
  Administered 2014-09-01 – 2014-09-04 (×4): 100 mg via ORAL
  Filled 2014-09-01 (×4): qty 1

## 2014-09-01 MED ORDER — LORAZEPAM 1 MG PO TABS
1.0000 mg | ORAL_TABLET | Freq: Four times a day (QID) | ORAL | Status: AC | PRN
  Administered 2014-09-01 – 2014-09-03 (×4): 1 mg via ORAL
  Filled 2014-09-01 (×4): qty 1

## 2014-09-01 MED ORDER — THIAMINE HCL 100 MG/ML IJ SOLN: 100.0000 mg | Freq: Every day | INTRAMUSCULAR | Status: DC

## 2014-09-01 MED ORDER — OCTREOTIDE LOAD VIA INFUSION
50.0000 ug | Freq: Once | INTRAVENOUS | Status: AC
  Administered 2014-09-01: 50 ug via INTRAVENOUS
  Filled 2014-09-01: qty 25

## 2014-09-01 MED ORDER — SODIUM CHLORIDE 0.9 % IV SOLN
INTRAVENOUS | Status: DC
  Administered 2014-09-01 – 2014-09-04 (×10): via INTRAVENOUS

## 2014-09-01 MED ORDER — DIPHENHYDRAMINE HCL 50 MG/ML IJ SOLN
INTRAMUSCULAR | Status: AC
  Filled 2014-09-01: qty 1

## 2014-09-01 MED ORDER — LORAZEPAM 2 MG/ML IJ SOLN
0.0000 mg | Freq: Four times a day (QID) | INTRAMUSCULAR | Status: AC
  Administered 2014-09-01: 1 mg via INTRAVENOUS
  Filled 2014-09-01: qty 1

## 2014-09-01 MED ORDER — NICOTINE 14 MG/24HR TD PT24
14.0000 mg | MEDICATED_PATCH | Freq: Every day | TRANSDERMAL | Status: DC
  Administered 2014-09-01 – 2014-09-04 (×4): 14 mg via TRANSDERMAL
  Filled 2014-09-01 (×4): qty 1

## 2014-09-01 MED ORDER — PANTOPRAZOLE SODIUM 40 MG IV SOLR
40.0000 mg | Freq: Once | INTRAVENOUS | Status: DC
  Administered 2014-09-01: 40 mg via INTRAVENOUS
  Filled 2014-09-01: qty 40

## 2014-09-01 MED ORDER — SODIUM CHLORIDE 0.9 % IV BOLUS (SEPSIS): 1000.0000 mL | Freq: Once | INTRAVENOUS | Status: DC

## 2014-09-01 NOTE — Brief Op Note (Signed)
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 Large amount of dark red blood and clots in stomach prevented visualization of most of the stomach. See endopro for details. Continue Protonix drip and Octreotide. NPO. IVFs. If rebleeding occurs, then would recommend embolization by IR. Dr. Merrily PewMagod/Edwards to f/u in the morning.

## 2014-09-01 NOTE — Progress Notes (Signed)
eLink Physician-Brief Progress Note Patient Name: Ronald RuddyDavid Stark DOB: 03/16/1986 MRN: [id number]   Date of Service  09/01/2014  HPI/Events of Note  [facility] s/p EGD which revealed pyloric channel ulcer. Hemodynamically stable  eICU Interventions  Care plan reviewed Monitor from eLink     Intervention Category Evaluation Type: New Patient Evaluation  Billy FischerDavid Simonds 09/01/2014, 3:40 AM

## 2014-09-01 NOTE — ED Notes (Signed)
Bed: WA07 Expected date:  Expected time:  Means of arrival:  Comments: EMS diarrhea, unable to get a temp

## 2014-09-01 NOTE — ED Notes (Signed)
Octreotide bolus stopped; 25 ml/hr continued

## 2014-09-01 NOTE — Interval H&P Note (Signed)
History and Physical Interval Note:  09/01/2014 1:38 AM  Ronald Stark  has presented today for surgery, with the diagnosis of GI bleed  The various methods of treatment have been discussed with the patient and family. After consideration of risks, benefits and other options for treatment, the patient has consented to  Procedure(s): ESOPHAGOGASTRODUODENOSCOPY (EGD) (N/A) as a surgical intervention .  The patient's history has been reviewed, patient examined, no change in status, stable for surgery.  I have reviewed the patient's chart and labs.  Questions were answered to the patient's satisfaction.     Tiersa Dayley C.

## 2014-09-01 NOTE — H&P (View-Only) (Signed)
Referring Provider: Dr. Ranae PalmsYelverton Primary Care Physician:  PROVIDER NOT IN SYSTEM Primary Gastroenterologist:  Gentry FitzUnassigned  Reason for Consultation:  GI bleed  HPI: Ronald Stark is a 29 y.o. male presented with GI bleeding that started abruptly today with dark red and black stools several times followed by dark red vomiting X 3. Having sharp diffuse abdominal pain and dizziness. Wife gives the entire history. Patient is constantly shivering during my evaluation and only able to give minimal history. He drinks 4-5 shots of Vodka per day for 2 years per his wife. He smokes marijuana but she denies any other illicit drugs. Takes narcotic pain meds for chronic back pain. Takes Ibuprofen daily but she states he has not had any Ibuprofen or alcohol today. Reportedly vomited dark-colored vomitus in the ER. Hypotensive at 93/52 and tachycardic at 136 on presentation. Hgb 12.1.  Past Medical History  Diagnosis Date  . Anxiety     Past Surgical History  Procedure Laterality Date  . Back surgery      Prior to Admission medications   Medication Sig Start Date End Date Taking? Authorizing Provider  diazepam (VALIUM) 5 MG tablet Take 5 mg by mouth every 6 (six) hours as needed for anxiety (anxiety).   Yes Historical Provider, MD  diphenhydrAMINE (BENADRYL) 25 MG tablet Take 25 mg by mouth every 6 (six) hours as needed for allergies (allergies).   Yes Historical Provider, MD  ibuprofen (ADVIL,MOTRIN) 200 MG tablet Take 200 mg by mouth every 6 (six) hours as needed for moderate pain (pain).   Yes Historical Provider, MD  venlafaxine XR (EFFEXOR-XR) 75 MG 24 hr capsule Take 75 mg by mouth daily with breakfast.   Yes Historical Provider, MD    Scheduled Meds: . octreotide  50 mcg Intravenous Once  . pantoprazole (PROTONIX) IV  40 mg Intravenous Once   Continuous Infusions: . sodium chloride    . octreotide  (SANDOSTATIN)    IV infusion    . pantoprozole (PROTONIX) infusion    . sodium chloride      PRN Meds:.  Allergies as of 09/01/2014  . (No Known Allergies)    History reviewed. No pertinent family history.  History   Social History  . Marital Status: Married    Spouse Name: N/A  . Number of Children: N/A  . Years of Education: N/A   Occupational History  . Not on file.   Social History Main Topics  . Smoking status: Current Every Day Smoker -- 0.50 packs/day    Types: Cigarettes  . Smokeless tobacco: Not on file  . Alcohol Use: Yes     Comment: daily  . Drug Use: Yes    Special: Marijuana  . Sexual Activity: Not on file   Other Topics Concern  . Not on file   Social History Narrative    Review of Systems: All negative except as stated above in HPI.  Physical Exam: Vital signs: Filed Vitals:   09/01/14 0100  BP: 105/92  Pulse: 127  Temp: 97.4  Resp: 20     General:   Somnolent, tremulous, thin, multiple tattoos Head: atraumatic Eyes: anicteric ENT: lips dry and cracked Neck: supple, nontender Lungs:  Clear throughout to auscultation.   No wheezes, crackles, or rhonchi. No acute distress. Heart:  Regular rate and rhythm; no murmurs, clicks, rubs,  or gallops. Abdomen: soft, nontender, nondistended, +BS   Rectal:  Deferred Ext: no edema Pulses: intact Skin: dry; multiple tattoos  GI:  Lab Results:  Recent Labs  09/01/14 0032 09/01/14 0050  WBC 10.2  --   HGB 12.1* 12.9*  HCT 36.0* 38.0*  PLT 250  --    BMET  Recent Labs  09/01/14 0050  NA 141  K 4.6  CL 107  GLUCOSE 193*  BUN 42*  CREATININE 1.10   LFT No results for input(s): PROT, ALBUMIN, AST, ALT, ALKPHOS, BILITOT, BILIDIR, IBILI in the last 72 hours. PT/INR  Recent Labs  09/01/14 0032  LABPROT 14.0  INR 1.07     Studies/Results: Dg Chest Port 1 View  09/01/2014   CLINICAL DATA:  Vomiting blood.  Lethargic  EXAM: PORTABLE CHEST - 1 VIEW  COMPARISON:  Radiograph 07/29/2014  FINDINGS: Normal mediastinum and cardiac silhouette. Normal pulmonary vasculature. No  evidence of effusion, infiltrate, or pneumothorax. No acute bony abnormality.  IMPRESSION: No acute cardiopulmonary process.   Electronically Signed   By: Genevive BiStewart  Edmunds M.D.   On: 09/01/2014 00:46    Impression/Plan: 29 yo with an upper GI bleed likely due to peptic ulcer disease but variceal bleed also possible. Protonix drip. Volume resuscitation. Emergent EGD. Octreotide. Supportive care.      Dorotea Hand C.  09/01/2014, 1:21 AM

## 2014-09-01 NOTE — Progress Notes (Signed)
PROGRESS NOTE  Ronald RuddyDavid Soberanis ZOX:[id number] DOB: 05/11/1985 DOA: 09/01/2014 PCP: PROVIDER NOT IN SYSTEM  HPI: Ronald Stark is a 29 y.o. male with past medical history back [facility], tobacco abuse, alcohol abuse, substance abuse, who presents with hematemesis and dark stool  Subjective / 24 H Interval events - admitted overnight, s/p EGD per Dr. Bosie ClosSchooler - sleepy and tired this morning, no further vomiting since admission - no chest pain, no dyspnea, no abdominal pain - his main concern this morning is if he will get pain medications on discharge  Assessment/Plan: Principal Problem:   GIB (gastrointestinal bleeding) Active Problems:   Tobacco abuse   Alcohol abuse   Substance abuse   Back pain   Hematemesis   Acute blood loss anemia   Upper GI bleed - with gastric ulcer per EGD report.  - patient with known NSAID use for chronic pain - continue IV PPI, Octreotide per GI - appears to have stopped but still ongoing high concern requiring stepdown monitoring.  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  Lethargic  EXAM: PORTABLE CHEST - 1 VIEW  COMPARISON:  Radiograph 07/29/2014  FINDINGS: Normal mediastinum and cardiac silhouette. Normal pulmonary vasculature. No evidence of  effusion, infiltrate, or pneumothorax. No acute bony abnormality.  IMPRESSION: No acute cardiopulmonary process.   Electronically Signed   By: Genevive BiStewart  Edmunds M.D.   On: 09/01/2014 00:46    Objective  Filed Vitals:   09/01/14 0204 09/01/14 0300 09/01/14 0400 09/01/14 0430  BP: 103/86 116/78  118/72  Pulse: 149 88  85  Temp:  98.4 F (36.9 C) 98.2 F (36.8 C)   TempSrc:  Oral Oral   Resp: 20   13  SpO2: 100% 100%  100%    Intake/Output Summary (Last 24 hours) at 09/01/14 [id number] Last data filed at 09/01/14 0500  Gross per 24 hour  Intake 333.33 ml  Output      0 ml  Net 333.33 ml   There were no vitals filed for this visit.  Exam:  General:  NAD  HEENT: no scleral icterus  Cardiovascular: RRR without MRG, 2+ peripheral pulses, no edema  Respiratory: CTA biL, good air movement, no wheezing, no crackles, no rales  Abdomen: soft, non tender, BS +, no guarding  MSK/Extremities: no clubbing/cyanosis, no joint swelling  Skin: no rashes  Neuro: non focal  Data Reviewed: Basic Metabolic Panel:  Recent Labs Lab 09/01/14 0032 09/01/14 0050 09/01/14 0335  NA 141 141 142  K 4.7 4.6 4.7  CL 111 107 116*  CO2 23  --  21*  GLUCOSE 200* 193* 132*  BUN 45* 42* 41*  CREATININE 1.12 1.10 0.83  CALCIUM 9.1  --  7.9*   Liver Function Tests:  Recent Labs Lab 09/01/14 0032 09/01/14 0335  AST 62* 48*  ALT 52 43  ALKPHOS 70 53  BILITOT 1.3* 0.9  PROT 6.6 5.5*  ALBUMIN 4.3 3.7    Recent Labs Lab 09/01/14 0032  LIPASE 16*   CBC:  Recent Labs Lab 09/01/14 0032 09/01/14 0050 09/01/14 0335 09/01/14 0625  WBC 10.2  --  7.9 7.1  NEUTROABS 8.0*  --   --   --   HGB 12.1* 12.9* 10.1* 9.9*  HCT 36.0* 38.0* 30.5* 29.3*  MCV 104.3*  --  104.1* 103.9*  PLT 250  --  184 157   Cardiac Enzymes:  Recent Labs Lab 09/01/14 0335 09/01/14 0625  TROPONINI 0.07* 0.06*    Recent Results (from the past 240 hour(s))  MRSA PCR Screening     Status: None   Collection  Time: 09/01/14  3:03 AM  Result Value Ref Range Status   MRSA by PCR NEGATIVE NEGATIVE Final    Comment:        The GeneXpert MRSA Assay (FDA approved for NASAL specimens only), is one component of a comprehensive MRSA colonization surveillance program. It is not intended to diagnose MRSA infection nor to guide or monitor treatment for MRSA infections.      Scheduled Meds: . folic acid  1 mg Oral Daily  . LORazepam  0-4 mg Intravenous 4 times per day   Followed by  . [START ON 09/03/2014] LORazepam  0-4 mg Intravenous Q12H  . multivitamin with minerals  1 tablet Oral Daily  . nicotine  14 mg Transdermal Daily  . sodium chloride  1,000 mL Intravenous Once  . sodium chloride  3 mL Intravenous Q12H  . thiamine  100 mg Oral Daily   Or  . thiamine  100 mg Intravenous Daily  . [START ON 09/02/2014] venlafaxine XR  75 mg Oral Q breakfast   Continuous Infusions: . sodium chloride 125 mL/hr at 09/01/14 0330  . octreotide  (SANDOSTATIN)    IV infusion 50 mcg (09/01/14 0500)  . pantoprozole (PROTONIX) infusion 8 mg/hr (09/01/14 0500)    Pamella Pertostin Gherghe, MD [facility] Pager [phone]. If 7 PM - 7 AM, please contact night-coverage at [email or web address], password [email or web address] 09/01/2014, 7:28 AM  LOS: 0 days

## 2014-09-01 NOTE — Op Note (Signed)
[facility] [address] [place], [id number]   ENDOSCOPY PROCEDURE REPORT  PATIENT: Ronald Stark, Ronald Stark  MR#: [id number] BIRTHDATE: 11-Nov-1985 , 28  yrs. old GENDER: male ENDOSCOPIST: Charlott RakesVincent Luvada Salamone, MD REFERRED BY:  hospital team PROCEDURE DATE:  09/01/2014 PROCEDURE:  EGD w/ control of bleeding ASA CLASS:     Class III INDICATIONS:  hematemesis and melena. MEDICATIONS: Fentanyl 100 mcg IV, Versed 10 mg IV, Benadryl 50 mg IV, and 2 cc WGN:FAOZHYepi:saline 1:10,000 U submucosal injection TOPICAL ANESTHETIC: Cetacaine Spray X 2  DESCRIPTION OF PROCEDURE: After the risks benefits and alternatives of the procedure were thoroughly explained, informed consent was obtained.  The [employer]AX GASTOROSCOPE [id number] endoscope was introduced through the mouth and advanced to the second portion of the duodenum , limited by peptic ulcer disease as stated below.  The instrument was slowly withdrawn as the mucosa was fully examined. Estimated blood loss is zero unless otherwise noted in this procedure report.    Dark red blood noted in distal esophagus with normal appearing esophageal mucosa. GEJ 42 cm from the incisors. Large amount of dark red and black fluid in the dependent portion of the stomach limiting views of the fundus and greater curvature of the body. In the distal stomach (pyloric channel) was an adherent clot with surrounding edema and erythema and a small part of the ulcer was visible below the clot. The clot did not dislodge with repeated irrigation and no active bleeding was seen from this area. The endoscope was not advanced into the 2nd portion of the duodenum due to concern of causing recurrent bleeding of the pyloric channel ulcer. The clot was about 1 cm in diameter but the exact size of the ulcer not known due to the adherent clot.  Patchy erythema seen in the distal stomach consistent with antral gastritis. Retroflexion limited by agitation and combativeness during  the procedure and by large amount of blood products and clots in the proximal stomach. No esophageal varices were seen but could not evaluate for gastric varices due to the blood products seen. 2 cc of epinephrine:saline 1:10,000 U injected submucosally in to the base of the ulcer with good blanching seen.             The scope was then withdrawn from the patient and the procedure completed.  COMPLICATIONS: There were no immediate complications.  ENDOSCOPIC IMPRESSION:     Medium-sized pylori channel ulcer with adherent clot without active bleeding seen(see above) Antral gastritis   RECOMMENDATIONS:     Continue Protonix drip; Octreotide; NPO; Volume resuscitation; If rebleeding occurs, then would recommend embolization as the next step   eSigned:  Charlott RakesVincent Rukiya Hodgkins, MD 09/01/2014 2:48 AM    CC:  CPT CODES: ICD CODES:  The ICD and CPT codes recommended by this software are interpretations from the data that the clinical staff has captured with the software.  The verification of the translation of this report to the ICD and CPT codes and modifiers is the sole responsibility of the health care institution and practicing physician where this report was generated.  [employer] will not be held responsible for the validity of the ICD and CPT codes included on this report.  AMA assumes no liability for data contained or not contained herein. CPT is a Publishing rights managerregistered trademark of the [employer].  PATIENT NAME:  Ronald Stark, Ronald Stark MR#: [id number]

## 2014-09-01 NOTE — ED Provider Notes (Signed)
CSN: [id number]     Arrival date & time 09/01/14  0003 History   First MD Initiated Contact with Patient 09/01/14 0020     Chief Complaint  Patient presents with  . Hematemesis     (Consider location/radiation/quality/duration/timing/severity/associated sxs/prior Treatment) HPI Patient presents by EMS for hematemesis 7-8 throughout the day. He also notes multiple dark bloody bowel movements. He's had upper abdominal pain. No previously similar symptoms. Patient also is experiencing diaphoresis, tremors and generalized fatigue. Admits to taking ibuprofen frequently for chronic pain. Patient is also on chronic narcotics for back pain. He takes Valium daily. According to his wife patient drinks 4-6 drinks daily. Has had Valium today but has not had any of his chronic narcotic pain medication or alcohol. Wife states the patient is not eaten or drank any fluids today. Initially found to be hypotensive by EMS. Actively vomiting red blood Past Medical History  Diagnosis Date  . Anxiety   . Tobacco abuse   . Alcohol abuse   . Substance abuse   . Bertolotti's syndrome    Past Surgical History  Procedure Laterality Date  . Back surgery     Family History  Problem Relation Age of Onset  . Heart disease Father    History  Substance Use Topics  . Smoking status: Current Every Day Smoker -- 0.50 packs/day    Types: Cigarettes  . Smokeless tobacco: Not on file  . Alcohol Use: Yes     Comment: daily    Review of Systems  Constitutional: Positive for chills, diaphoresis and fatigue.  Respiratory: Negative for shortness of breath.   Cardiovascular: Negative for chest pain.  Gastrointestinal: Positive for nausea, vomiting, abdominal pain, diarrhea and blood in stool.  Musculoskeletal: Negative for neck pain and neck stiffness.  Skin: Negative for rash and wound.  Neurological: Positive for weakness (generalized) and light-headedness.  All other systems reviewed and are  negative.     Allergies  Review of patient's allergies indicates no known allergies.  Home Medications   Prior to Admission medications   Medication Sig Start Date End Date Taking? Authorizing Provider  diazepam (VALIUM) 5 MG tablet Take 5 mg by mouth every 6 (six) hours as needed for anxiety (anxiety).   Yes Historical Provider, MD  diphenhydrAMINE (BENADRYL) 25 MG tablet Take 25 mg by mouth every 6 (six) hours as needed for allergies (allergies).   Yes Historical Provider, MD  ibuprofen (ADVIL,MOTRIN) 200 MG tablet Take 200 mg by mouth every 6 (six) hours as needed for moderate pain (pain).   Yes Historical Provider, MD  venlafaxine XR (EFFEXOR-XR) 75 MG 24 hr capsule Take 75 mg by mouth daily with breakfast.   Yes Historical Provider, MD   BP 103/86 mmHg  Pulse 149  Temp(Src) 97.4 F (36.3 C) (Oral)  Resp 20  SpO2 100% Physical Exam  Constitutional: He is oriented to person, place, and time. He appears well-developed. He appears distressed.  Diaphoretic, pale, actively vomiting blood  HENT:  Head: Normocephalic and atraumatic.  Mouth/Throat: Oropharynx is clear and moist. No oropharyngeal exudate.  Eyes: EOM are normal.  Pupils 6 mm and reactive to light bilaterally.  Neck: Normal range of motion. Neck supple.  No meningismus  Cardiovascular: Regular rhythm.   Tachycardia  Pulmonary/Chest: Effort normal and breath sounds normal. No respiratory distress. He has no wheezes. He has no rales. He exhibits no tenderness.  Abdominal: Soft. Bowel sounds are normal. He exhibits no distension and no mass. There is tenderness (Tenderness to  palpation epigastric and left upper quadrants.). There is no rebound and no guarding.  Musculoskeletal: Normal range of motion. He exhibits no edema or tenderness.  Neurological: He is oriented to person, place, and time.  Went to some questions but is mildly lethargic. Moves all extremities without focal deficit. Sensation is grossly intact.   Skin: No rash noted. He is diaphoretic. No erythema.  Cool and diaphoretic  Psychiatric: He has a normal mood and affect. His behavior is normal.  Nursing note and vitals reviewed.   ED Course  Procedures (including critical care time) Labs Review Labs Reviewed  CBC WITH DIFFERENTIAL/PLATELET - Abnormal; Notable for the following:    RBC 3.45 (*)    Hemoglobin 12.1 (*)    HCT 36.0 (*)    MCV 104.3 (*)    MCH 35.1 (*)    Neutrophils Relative % 78 (*)    Neutro Abs 8.0 (*)    All other components within normal limits  COMPREHENSIVE METABOLIC PANEL - Abnormal; Notable for the following:    Glucose, Bld 200 (*)    BUN 45 (*)    AST 62 (*)    Total Bilirubin 1.3 (*)    All other components within normal limits  LIPASE, BLOOD - Abnormal; Notable for the following:    Lipase 16 (*)    All other components within normal limits  URINE RAPID DRUG SCREEN (HOSP PERFORMED) - Abnormal; Notable for the following:    Opiates POSITIVE (*)    Benzodiazepines POSITIVE (*)    Tetrahydrocannabinol POSITIVE (*)    All other components within normal limits  I-STAT CG4 LACTIC ACID, ED - Abnormal; Notable for the following:    Lactic Acid, Venous 2.87 (*)    All other components within normal limits  I-STAT CHEM 8, ED - Abnormal; Notable for the following:    BUN 42 (*)    Glucose, Bld 193 (*)    Hemoglobin 12.9 (*)    HCT 38.0 (*)    All other components within normal limits  PROTIME-INR  URINALYSIS, ROUTINE W REFLEX MICROSCOPIC  ETHANOL  APTT  TYPE AND SCREEN  PREPARE RBC (CROSSMATCH)  ABO/RH    Imaging Review Dg Chest Port 1 View  09/01/2014   CLINICAL DATA:  Vomiting blood.  Lethargic  EXAM: PORTABLE CHEST - 1 VIEW  COMPARISON:  Radiograph 07/29/2014  FINDINGS: Normal mediastinum and cardiac silhouette. Normal pulmonary vasculature. No evidence of effusion, infiltrate, or pneumothorax. No acute bony abnormality.  IMPRESSION: No acute cardiopulmonary process.   Electronically Signed    By: Genevive BiStewart  Edmunds M.D.   On: 09/01/2014 00:46     EKG Interpretation   Date/Time:  Wednesday Sep 01 2014 00:43:46 EDT Ventricular Rate:  116 PR Interval:  81 QRS Duration: 86 QT Interval:  307 QTC Calculation: 426 R Axis:   90 Text Interpretation:  Sinus tachycardia Borderline right axis deviation  Confirmed by Ranae PalmsYELVERTON  MD, Jatavian ([id number]) on 09/01/2014 2:06:14 AM       CRITICAL CARE Performed by: Ranae PalmsYELVERTON, Helix Total critical care time: 60 min Critical care time was exclusive of separately billable procedures and treating other patients. Critical care was necessary to treat or prevent imminent or life-threatening deterioration. Critical care was time spent personally by me on the following activities: development of treatment plan with patient and/or surrogate as well as nursing, discussions with consultants, evaluation of patient's response to treatment, examination of patient, obtaining history from patient or surrogate, ordering and performing treatments and interventions, ordering  and review of laboratory studies, ordering and review of radiographic studies, pulse oximetry and re-evaluation of patient's condition.  MDM   Final diagnoses:  Vomiting blood  Withdrawal from opioids  Hypotension, unspecified hypotension type    Discussed with Dr. Bosie Clos. We will alert endoscopy team and see in the emergency department. Advised octreotide and Protonix drips. Also discussed with Dr. Darrol Angel. Will evaluate in the emergency department. Next  Patient's received 3 L of normal saline with improvement of heart rate and blood pressure. He also received dose of Ativan due to concern for withdrawal symptoms complicating clinical picture. The patient is no longer diaphoretic and appears much more comfortable. He's had no further episodes of vomiting blood. 2 large-bore IVs have been established. Initial hemoglobin is stable at 12.  Dr. Bosie Clos is at bedside and will perform endoscopy  in the ED. Blood pressure has improved as well as heart rate. Dr. Darrol Angel discussed with [facility] and Dr. Clyde Lundborg is at bedside and will admit to step down bed.  Loren Racer, MD 09/01/14 [phone]

## 2014-09-01 NOTE — Care Management Note (Signed)
Case Management Note  Patient Details  Name: Ronald Stark MRN: [id number] Date of Birth: 09/16/1985  Subjective/Objective:          Gi bleed          Action/Plan: Will follow  Expected Discharge Date:  09/03/14               Expected Discharge Plan:  Home/Self Care  In-House Referral:  NA  Discharge planning Services  CM Consult  Post Acute Care Choice:  NA Choice offered to:  NA  DME Arranged:  N/A DME Agency:  NA  HH Arranged:    HH Agency:  NA  Status of Service:     Medicare Important Message Given:    Date Medicare IM Given:    Medicare IM give by:    Date Additional Medicare IM Given:    Additional Medicare Important Message give by:     If discussed at Long Length of Stay Meetings, dates discussed:    Additional Comments:  Golda AcreDavis, Edahi Kroening Lynn, RN 09/01/2014, 9:21 AM

## 2014-09-01 NOTE — H&P (Addendum)
[facility] History and Physical  [name] ZOX:[id number] DOB: 02-Jan-1986 DOA: 09/01/2014  Referring physician: ED physician PCP: PROVIDER NOT IN SYSTEM  Specialists:   Chief Complaint: Hematemesis and dark stool  HPI: Ronald Stark is a 29 y.o. male with past medical history back pain due to [facility] syndrome, tobacco abuse, alcohol abuse, substance abuse, who presents with hematemesis and dark stool  Patient has a chronic back pain, and is taking ibuprofen frequently for chronic pain. Per his wife, patient drinks 4-6 drinks daily. He also uses valium for anxiety and Percocet for back pain. Patient started having hematemesis at about 2 PM yesterday, he had vomited dark blood for 7-8 times, which is in large volume per his wife. He also has dark stools. Patient is experiencing diaphoresis, tremors and generalized fatigue. His wife states the patient has not eaten or drank any fluids today. He has mild abdominal pain, mild SOB, but no chest pain. Initially patient was found to be hypotension which responded to IVF. His blood pressure is 105/92 when I saw patient in ED.   In ED, patient was found to have tachycardia, hemoglobin 12.1, electrolytes okay, lactate 2.87, INR 1.07, lipase negative. negative chest x-ray. PCCM was consulted. Dr. Sung Amabile recommended to admit to SDU [facility]. GI was consulted by ED. Patient is admitted to inpatient for further evaluation and treatment. IV octreotide and Protonix were started in ED. 2 units of blood were ordered by ED.  Review of Systems:   Constitutional: Patient is tremulous, No fever, no chills; No weight loss, no weight gain, no fatigue.  HEENT: No blurry vision, no diplopia, no pharyngitis, no dysphagia  CV: No chest pain, has palpitations, no PND, no orthopnea, no edema. Resp: has mild SOB and cough, No chest pain,  no pleuritic pain. GI: has nausea and vomiting, no diarrhea, has hematemesis, melena and abdominal pain.  GU: No dysuria, no hematuria,  no frequency, no urgency.  MSK: no myalgias, no arthralgias.  Neuro: No headache, no focal neurological deficits, no history of seizures. Psych: No suicidal or homicidal idiation Endo: No heat intolerance, no cold intolerance, no polyuria, no polydipsia  Skin: No rashes, no skin lesions.  Heme: No easy bruising.  Travel history: No recent long distant travel.   Where does patient live? At home   Can patient participate in ADLs? Yes  Allergy: No Known Allergies  Past Medical History  Diagnosis Date  . Anxiety   . Tobacco abuse   . Alcohol abuse   . Substance abuse   . Bertolotti's syndrome     Past Surgical History  Procedure Laterality Date  . Back surgery      Social History:  reports that he has been smoking Cigarettes.  He has been smoking about 0.50 packs per day. He does not have any smokeless tobacco history on file. He reports that he drinks alcohol. He reports that he uses illicit drugs (Marijuana).  Family History:  Family History  Problem Relation Age of Onset  . Heart disease Father      Prior to Admission medications   Medication Sig Start Date End Date Taking? Authorizing Provider  diazepam (VALIUM) 5 MG tablet Take 5 mg by mouth every 6 (six) hours as needed for anxiety (anxiety).   Yes Historical Provider, MD  diphenhydrAMINE (BENADRYL) 25 MG tablet Take 25 mg by mouth every 6 (six) hours as needed for allergies (allergies).   Yes Historical Provider, MD  ibuprofen (ADVIL,MOTRIN) 200 MG tablet Take 200 mg by  mouth every 6 (six) hours as needed for moderate pain (pain).   Yes Historical Provider, MD  venlafaxine XR (EFFEXOR-XR) 75 MG 24 hr capsule Take 75 mg by mouth daily with breakfast.   Yes Historical Provider, MD    Physical Exam: Filed Vitals:   09/01/14 0100 09/01/14 0131 09/01/14 0136 09/01/14 0204  BP: 105/92 107/63  103/86  Pulse: 127 74 90 149  Temp:      TempSrc:      Resp: 20 13 20 20   SpO2: 100% 100% 100% 100%   General: Not in acute  distress. Tremulous HEENT:       Eyes: PERRL, EOMI, no scleral icterus       ENT: No discharge from the ears and nose, no pharynx injection, no tonsillar enlargement.        Neck: No JVD, no bruit, no mass felt. Heme: No neck or axillary lymph node enlargement Cardiac: S1/S2, RRR, tachycardia, No murmurs, No gallops or rubs Pulm: Good air movement bilaterally. Clear to auscultation bilaterally. No rales, wheezing, rhonchi or rubs. Abd: Soft, nondistended, mild tenderness over epigastric area, no rebound pain, no organomegaly, BS present Ext: No edema bilaterally. 2+DP/PT pulse bilaterally Musculoskeletal: No joint deformities, erythema, or stiffness, ROM full Skin: No rashes.  Neuro: drowsy, but oriented X3, cranial nerves II-XII grossly intact, muscle strength 5/5 in all extremeties, sensation to light touch intact.  Psych: Patient is not psychotic, no suicidal or hemocidal ideation.  Labs on Admission:  Basic Metabolic Panel:  Recent Labs Lab 09/01/14 0032 09/01/14 0050  NA 141 141  K 4.7 4.6  CL 111 107  CO2 23  --   GLUCOSE 200* 193*  BUN 45* 42*  CREATININE 1.12 1.10  CALCIUM 9.1  --    Liver Function Tests:  Recent Labs Lab 09/01/14 0032  AST 62*  ALT 52  ALKPHOS 70  BILITOT 1.3*  PROT 6.6  ALBUMIN 4.3    Recent Labs Lab 09/01/14 0032  LIPASE 16*   No results for input(s): AMMONIA in the last 168 hours. CBC:  Recent Labs Lab 09/01/14 0032 09/01/14 0050  WBC 10.2  --   NEUTROABS 8.0*  --   HGB 12.1* 12.9*  HCT 36.0* 38.0*  MCV 104.3*  --   PLT 250  --    Cardiac Enzymes: No results for input(s): CKTOTAL, CKMB, CKMBINDEX, TROPONINI in the last 168 hours.  BNP (last 3 results) No results for input(s): BNP in the last 8760 hours.  ProBNP (last 3 results) No results for input(s): PROBNP in the last 8760 hours.  CBG: No results for input(s): GLUCAP in the last 168 hours.  Radiological Exams on Admission: Dg Chest Port 1 View  09/01/2014    CLINICAL DATA:  Vomiting blood.  Lethargic  EXAM: PORTABLE CHEST - 1 VIEW  COMPARISON:  Radiograph 07/29/2014  FINDINGS: Normal mediastinum and cardiac silhouette. Normal pulmonary vasculature. No evidence of effusion, infiltrate, or pneumothorax. No acute bony abnormality.  IMPRESSION: No acute cardiopulmonary process.   Electronically Signed   By: Genevive BiStewart  Edmunds M.D.   On: 09/01/2014 00:46    EKG: Independently reviewed.  Abnormal findings: Tachycardia  Assessment/Plan Principal Problem:   GIB (gastrointestinal bleeding) Active Problems:   Tobacco abuse   Alcohol abuse   Substance abuse   Back pain   Hematemesis  GIB: It is most likely from gastric ulcer. Dr. Bosie ClosSchooler did EGD at bedside. Gastric ulcer was found which is not actively bleeding. Currently patient has tachycardia, but  hemodynamically stable. His hemoglobin was 12.1 on initial CBC, which is likely falsely low due to lack of time to have hemoglobin balanced. INR 1.07. - Will admit to ICU bed - Appreciate Dr. Louis Matteschoolers consultation - NPO - IVF: Received 3 L normal saline bolus, followed by 125 mL per hour mL/hr - IV pantoprazole gtt  - IV Octreotide infusion - Zofran IV for nausea - Avoid NSAIDs and SQ heparin - Maintain IV access (2 large bore IVs if possible). - Monitor closely and follow q6h cbc, transfuse as necessary. - LaB: troponin x 1 - repeat Lactate in am - per Dr. Bosie ClosSchooler, if rebleeding, needs to call INR   [facility]  -Hold home oral Percocet and ibuprofen -When necessary morphine  Polysubstance abuse: including alcohol, tobacco and marijuana -Did counseling about importance of quitting smoking -Nicotine patch -Did counseling about the importance of quitting drinking -CIWA protocol -check UDS and HIV ab -consult to SW  DVT ppx: SCD  Code Status: Full code Family Communication: Yes, patient's  wife     at bed side Disposition Plan: Admit to inpatient   Date of  Service 09/01/2014    Lorretta HarpIU, Macarena Langseth [facility] Pager [phone]  If 7PM-7AM, please contact night-coverage [email or web address] Password [email or web address] 09/01/2014, 2:27 AM

## 2014-09-01 NOTE — ED Notes (Signed)
Waste 1.5 mg of lorazepam with Milus GlazierJennifer Hamilton, RN

## 2014-09-01 NOTE — Progress Notes (Signed)
[facility] PROGRESS NOTE Subjective patient had large pyloric channel ulcer with here clot. Has not had any gross bloody stools today denies abdominal pain. Some protonix trip  Objective: Vital signs in last 24 hours: Temp:  [97.4 F (36.3 C)-99.1 F (37.3 C)] 99.1 F (37.3 C) (05/04 1200) Pulse Rate:  [74-149] 85 (05/04 1200) Resp:  [11-20] 17 (05/04 1200) BP: (93-118)/(52-92) 117/71 mmHg (05/04 1200) SpO2:  [99 %-100 %] 100 % (05/04 1200) Weight:  [62.9 kg (138 lb 10.7 oz)] 62.9 kg (138 lb 10.7 oz) (05/04 0300) Last BM Date: 09/01/14  Intake/Output from previous day: 05/03 0701 - 05/04 0700 In: 683.3 [I.V.:683.3] Out: 350 [Urine:350] Intake/Output this shift: Total I/O In: 1100 [I.V.:1100] Out: 450 [Urine:450]  PE: General--no acute distress  Abdomen-- minimally tender  Lab Results:  Recent Labs  09/01/14 0032 09/01/14 0050 09/01/14 0335 09/01/14 0625 09/01/14 1512  WBC 10.2  --  7.9 7.1 4.8  HGB 12.1* 12.9* 10.1* 9.9* 8.3*  HCT 36.0* 38.0* 30.5* 29.3* 24.8*  PLT 250  --  184 157 144*   BMET  Recent Labs  09/01/14 0032 09/01/14 0050 09/01/14 0335  NA 141 141 142  K 4.7 4.6 4.7  CL 111 107 116*  CO2 23  --  21*  CREATININE 1.12 1.10 0.83   LFT  Recent Labs  09/01/14 0032 09/01/14 0335  PROT 6.6 5.5*  AST 62* 48*  ALT 52 43  ALKPHOS 70 53  BILITOT 1.3* 0.9   PT/INR  Recent Labs  09/01/14 0032  LABPROT 14.0  INR 1.07   PANCREAS  Recent Labs  09/01/14 0032  LIPASE 16*         Studies/Results: Dg Chest Port 1 View  09/01/2014   CLINICAL DATA:  Vomiting blood.  Lethargic  EXAM: PORTABLE CHEST - 1 VIEW  COMPARISON:  Radiograph 07/29/2014  FINDINGS: Normal mediastinum and cardiac silhouette. Normal pulmonary vasculature. No evidence of effusion, infiltrate, or pneumothorax. No acute bony abnormality.  IMPRESSION: No acute cardiopulmonary process.   Electronically Signed   By: Genevive BiStewart  Edmunds M.D.   On: 09/01/2014 00:46     Medications: I have reviewed the patient's current medications.  Assessment/Plan: 1. Bleeding gastric ulcer. Patient seems relatively stable but risk of re-bleeding still present. Would continue protonix trip and continuing NPO in case he rebleeds. We will continue to follow.   Jaleigh Mccroskey JR,Trey Bebee L 09/01/2014, 3:37 PM  Pager: [phone] If no answer or after hours call [phone]

## 2014-09-01 NOTE — Consult Note (Signed)
Referring Provider: Dr. Yelverton Primary Care Physician:  PROVIDER NOT IN SYSTEM Primary Gastroenterologist:  Unassigned  Reason for Consultation:  GI bleed  HPI: Ronald Stark is a 29 y.o. male presented with GI bleeding that started abruptly today with dark red and black stools several times followed by dark red vomiting X 3. Having sharp diffuse abdominal pain and dizziness. Wife gives the entire history. Patient is constantly shivering during my evaluation and only able to give minimal history. He drinks 4-5 shots of Vodka per day for 2 years per his wife. He smokes marijuana but she denies any other illicit drugs. Takes narcotic pain meds for chronic back pain. Takes Ibuprofen daily but she states he has not had any Ibuprofen or alcohol today. Reportedly vomited dark-colored vomitus in the ER. Hypotensive at 93/52 and tachycardic at 136 on presentation. Hgb 12.1.  Past Medical History  Diagnosis Date  . Anxiety     Past Surgical History  Procedure Laterality Date  . Back surgery      Prior to Admission medications   Medication Sig Start Date End Date Taking? Authorizing Provider  diazepam (VALIUM) 5 MG tablet Take 5 mg by mouth every 6 (six) hours as needed for anxiety (anxiety).   Yes Historical Provider, MD  diphenhydrAMINE (BENADRYL) 25 MG tablet Take 25 mg by mouth every 6 (six) hours as needed for allergies (allergies).   Yes Historical Provider, MD  ibuprofen (ADVIL,MOTRIN) 200 MG tablet Take 200 mg by mouth every 6 (six) hours as needed for moderate pain (pain).   Yes Historical Provider, MD  venlafaxine XR (EFFEXOR-XR) 75 MG 24 hr capsule Take 75 mg by mouth daily with breakfast.   Yes Historical Provider, MD    Scheduled Meds: . octreotide  50 mcg Intravenous Once  . pantoprazole (PROTONIX) IV  40 mg Intravenous Once   Continuous Infusions: . sodium chloride    . octreotide  (SANDOSTATIN)    IV infusion    . pantoprozole (PROTONIX) infusion    . sodium chloride      PRN Meds:.  Allergies as of 09/01/2014  . (No Known Allergies)    History reviewed. No pertinent family history.  History   Social History  . Marital Status: Married    Spouse Name: N/A  . Number of Children: N/A  . Years of Education: N/A   Occupational History  . Not on file.   Social History Main Topics  . Smoking status: Current Every Day Smoker -- 0.50 packs/day    Types: Cigarettes  . Smokeless tobacco: Not on file  . Alcohol Use: Yes     Comment: daily  . Drug Use: Yes    Special: Marijuana  . Sexual Activity: Not on file   Other Topics Concern  . Not on file   Social History Narrative    Review of Systems: All negative except as stated above in HPI.  Physical Exam: Vital signs: Filed Vitals:   09/01/14 0100  BP: 105/92  Pulse: 127  Temp: 97.4  Resp: 20     General:   Somnolent, tremulous, thin, multiple tattoos Head: atraumatic Eyes: anicteric ENT: lips dry and cracked Neck: supple, nontender Lungs:  Clear throughout to auscultation.   No wheezes, crackles, or rhonchi. No acute distress. Heart:  Regular rate and rhythm; no murmurs, clicks, rubs,  or gallops. Abdomen: soft, nontender, nondistended, +BS   Rectal:  Deferred Ext: no edema Pulses: intact Skin: dry; multiple tattoos  GI:  Lab Results:  Recent Labs    09/01/14 0032 09/01/14 0050  WBC 10.2  --   HGB 12.1* 12.9*  HCT 36.0* 38.0*  PLT 250  --    BMET  Recent Labs  09/01/14 0050  NA 141  K 4.6  CL 107  GLUCOSE 193*  BUN 42*  CREATININE 1.10   LFT No results for input(s): PROT, ALBUMIN, AST, ALT, ALKPHOS, BILITOT, BILIDIR, IBILI in the last 72 hours. PT/INR  Recent Labs  09/01/14 0032  LABPROT 14.0  INR 1.07     Studies/Results: Dg Chest Port 1 View  09/01/2014   CLINICAL DATA:  Vomiting blood.  Lethargic  EXAM: PORTABLE CHEST - 1 VIEW  COMPARISON:  Radiograph 07/29/2014  FINDINGS: Normal mediastinum and cardiac silhouette. Normal pulmonary vasculature. No  evidence of effusion, infiltrate, or pneumothorax. No acute bony abnormality.  IMPRESSION: No acute cardiopulmonary process.   Electronically Signed   By: Genevive BiStewart  Edmunds M.D.   On: 09/01/2014 00:46    Impression/Plan: 29 yo with an upper GI bleed likely due to peptic ulcer disease but variceal bleed also possible. Protonix drip. Volume resuscitation. Emergent EGD. Octreotide. Supportive care.      Samani Deal C.  09/01/2014, 1:21 AM

## 2014-09-01 NOTE — ED Notes (Signed)
Patient arrived via EMS, extremely pale and diaphoretic.  They were called to his house due to complaints of bloody emesis and diarrhea all day.  They saw a large amount of dark emesis in trash can at residence.  Patient reports drinking daily, but denies ETOH use today.  History of back problems and anxiety.

## 2014-09-02 ENCOUNTER — Encounter (HOSPITAL_COMMUNITY): Payer: Self-pay | Admitting: Gastroenterology

## 2014-09-02 LAB — CBC
HCT: 26.1 % — ABNORMAL LOW (ref 39.0–52.0)
HEMATOCRIT: 25.2 % — AB (ref 39.0–52.0)
HEMATOCRIT: 25.5 % — AB (ref 39.0–52.0)
HEMATOCRIT: 24.5 % — AB (ref 39.0–52.0)
HEMOGLOBIN: 8.6 g/dL — AB (ref 13.0–17.0)
HEMOGLOBIN: 9.1 g/dL — AB (ref 13.0–17.0)
Hemoglobin: 8.6 g/dL — ABNORMAL LOW (ref 13.0–17.0)
Hemoglobin: 8.3 g/dL — ABNORMAL LOW (ref 13.0–17.0)
MCH: 35.4 pg — ABNORMAL HIGH (ref 26.0–34.0)
MCH: 34.8 pg — ABNORMAL HIGH (ref 26.0–34.0)
MCH: 34.4 pg — ABNORMAL HIGH (ref 26.0–34.0)
MCH: 35 pg — ABNORMAL HIGH (ref 26.0–34.0)
MCHC: 34.1 g/dL (ref 30.0–36.0)
MCHC: 33.7 g/dL (ref 30.0–36.0)
MCHC: 33.9 g/dL (ref 30.0–36.0)
MCHC: 34.9 g/dL (ref 30.0–36.0)
MCV: 103.7 fL — ABNORMAL HIGH (ref 78.0–100.0)
MCV: 103.2 fL — ABNORMAL HIGH (ref 78.0–100.0)
MCV: 101.7 fL — ABNORMAL HIGH (ref 78.0–100.0)
MCV: 100.4 fL — ABNORMAL HIGH (ref 78.0–100.0)
PLATELETS: 165 10*3/uL (ref 150–400)
PLATELETS: 176 10*3/uL (ref 150–400)
Platelets: 154 10*3/uL (ref 150–400)
Platelets: 164 10*3/uL (ref 150–400)
RBC: 2.43 MIL/uL — AB (ref 4.22–5.81)
RBC: 2.47 MIL/uL — ABNORMAL LOW (ref 4.22–5.81)
RBC: 2.41 MIL/uL — AB (ref 4.22–5.81)
RBC: 2.6 MIL/uL — AB (ref 4.22–5.81)
RDW: 11.3 % — ABNORMAL LOW (ref 11.5–15.5)
RDW: 11.3 % — ABNORMAL LOW (ref 11.5–15.5)
RDW: 11.3 % — ABNORMAL LOW (ref 11.5–15.5)
RDW: 11.2 % — ABNORMAL LOW (ref 11.5–15.5)
WBC: 4.1 10*3/uL (ref 4.0–10.5)
WBC: 4.6 10*3/uL (ref 4.0–10.5)
WBC: 4.4 10*3/uL (ref 4.0–10.5)
WBC: 4.6 10*3/uL (ref 4.0–10.5)

## 2014-09-02 LAB — GLUCOSE, CAPILLARY: GLUCOSE-CAPILLARY: 97 mg/dL (ref 70–99)

## 2014-09-02 NOTE — Progress Notes (Signed)
[facility] PROGRESS NOTE Subjective No gross bleeding or pain  Objective: Vital signs in last 24 hours: Temp:  [97.9 F (36.6 C)-98.2 F (36.8 C)] 98.2 F (36.8 C) (05/05 0800) Pulse Rate:  [71-98] 71 (05/05 1000) Resp:  [10-16] 13 (05/05 0800) BP: (109-134)/(67-99) 124/89 mmHg (05/05 1000) SpO2:  [100 %] 100 % (05/05 0800) Weight:  [63.4 kg (139 lb 12.4 oz)] 63.4 kg (139 lb 12.4 oz) (05/05 0500) Last BM Date: 09/02/14  Intake/Output from previous day: 05/04 0701 - 05/05 0700 In: 4222.5 [P.O.:60; I.V.:4162.5] Out: 2250 [Urine:2250] Intake/Output this shift: Total I/O In: 976.5 [I.V.:976.5] Out: 1000 [Urine:1000]  PE:  Abdomen--nontender  Lab Results:  Recent Labs  09/01/14 0625 09/01/14 1512 09/01/14 2103 09/02/14 0325 09/02/14 0929  WBC 7.1 4.8 5.0 4.1 4.6  HGB 9.9* 8.3* 8.2* 8.6* 8.6*  HCT 29.3* 24.8* 24.9* 25.2* 25.5*  PLT 157 144* 133* 154 164   BMET  Recent Labs  09/01/14 0032 09/01/14 0050 09/01/14 0335  NA 141 141 142  K 4.7 4.6 4.7  CL 111 107 116*  CO2 23  --  21*  CREATININE 1.12 1.10 0.83   LFT  Recent Labs  09/01/14 0032 09/01/14 0335  PROT 6.6 5.5*  AST 62* 48*  ALT 52 43  ALKPHOS 70 53  BILITOT 1.3* 0.9   PT/INR  Recent Labs  09/01/14 0032  LABPROT 14.0  INR 1.07   PANCREAS  Recent Labs  09/01/14 0032  LIPASE 16*         Studies/Results: Dg Chest Port 1 View  09/01/2014   CLINICAL DATA:  Vomiting blood.  Lethargic  EXAM: PORTABLE CHEST - 1 VIEW  COMPARISON:  Radiograph 07/29/2014  FINDINGS: Normal mediastinum and cardiac silhouette. Normal pulmonary vasculature. No evidence of effusion, infiltrate, or pneumothorax. No acute bony abnormality.  IMPRESSION: No acute cardiopulmonary process.   Electronically Signed   By: Genevive BiStewart  Edmunds M.D.   On: 09/01/2014 00:46    Medications: I have reviewed the patient's current medications.  Assessment/Plan: 1. Bleeding GU. Appears to have stopped. Will  advance diet.   Jonathon Castelo JR,Winona Sison L 09/02/2014, 1:33 PM  Pager: [phone] If no answer or after hours call [phone]

## 2014-09-02 NOTE — Progress Notes (Signed)
PROGRESS NOTE  Ronald Stark DOB: 01/19/1986 DOA: 09/01/2014 PCP: PROVIDER NOT IN SYSTEM  HPI: Ronald Stark with past medical history [facility], tobacco abuse, alcohol abuse, substance abuse, who presents with hematemesis and dark stool  Subjective / 24 H Interval events - doing well this morning, denies further emesis - no BMs yet - no chest pain/shortness of breath   Assessment/Plan: Principal Problem:   GIB (gastrointestinal bleeding) Active Problems:   Tobacco abuse   Alcohol abuse   Substance abuse   Back pain   Hematemesis   Acute blood loss anemia   Upper GI bleed - with gastric ulcer per EGD report.  - patient with known NSAID use for chronic pain - GI following, advance diet  - if he re-bleeds will need IR consult - move to floor today   Acute blood loss anemia - due to #1 - closely monitor CBC every 6h until tonight  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 - hold NSAIDs - continue IV morphine for now  Polysubstance abuse - including alcohol, tobacco and marijuana - continue CIWA - counseled today    Diet: Diet clear liquid Room service appropriate?: Yes; Fluid consistency:: Thin Fluids: NS DVT Prophylaxis: SCD  Code Status: Full Code Family Communication: wife at bedside Disposition Plan: remain in SDU  Consultants:  GI  Procedures:  EGD 5/4 Medium-sized pylori channel ulcer with adherent clot without active bleeding seen(see above) Antral gastritis   Antibiotics  Anti-infectives    None       Studies  Dg Chest Port 1 View  09/01/2014   CLINICAL DATA:  Vomiting blood.  Lethargic  EXAM: PORTABLE CHEST - 1 VIEW  COMPARISON:  Radiograph 07/29/2014  FINDINGS: Normal mediastinum and cardiac silhouette. Normal pulmonary vasculature. No evidence of effusion, infiltrate, or pneumothorax. No acute bony abnormality.  IMPRESSION: No acute cardiopulmonary process.   Electronically Signed   By:  Genevive BiStewart  Edmunds M.D.   On: 09/01/2014 00:46    Objective  Filed Vitals:   09/02/14 0600 09/02/14 0719 09/02/14 0800 09/02/14 1000  BP: 125/81  126/83 124/89  Pulse: 76 77 77 71  Temp:   98.2 F (36.8 C)   TempSrc:   Oral   Resp: 12 12 13    Height:      Weight:      SpO2: 100% 100% 100%     Intake/Output Summary (Last 24 hours) at 09/02/14 1401 Last data filed at 09/02/14 1035  Gross per 24 hour  Intake   4099 ml  Output   2800 ml  Net   1299 ml   Filed Weights   09/01/14 0300 09/02/14 0500  Weight: 62.9 kg (138 lb 10.7 oz) 63.4 kg (139 lb 12.4 oz)    Exam:  General:  NAD  HEENT: no scleral icterus  Cardiovascular: RRR without MRG, 2+ peripheral pulses, no edema  Respiratory: CTA biL, good air movement, no wheezing, no crackles, no rales  Abdomen: soft, non tender, BS +, no guarding  MSK/Extremities: no clubbing/cyanosis, no joint swelling  Skin: no rashes  Data Reviewed: Basic Metabolic Panel:  Recent Labs Lab 09/01/14 0032 09/01/14 0050 09/01/14 0335  NA 141 141 142  K 4.7 4.6 4.7  CL 111 107 116*  CO2 23  --  21*  GLUCOSE 200* 193* 132*  BUN 45* 42* 41*  CREATININE 1.12 1.10 0.83  CALCIUM 9.1  --  7.9*   Liver Function Tests:  Recent Labs Lab 09/01/14 0032 09/01/14 0335  AST 62* 48*  ALT 52 43  ALKPHOS 70 53  BILITOT 1.3* 0.9  PROT 6.6 5.5*  ALBUMIN 4.3 3.7    Recent Labs Lab 09/01/14 0032  LIPASE 16*   CBC:  Recent Labs Lab 09/01/14 0032  09/01/14 0625 09/01/14 1512 09/01/14 2103 09/02/14 0325 09/02/14 0929  WBC 10.2  < > 7.1 4.8 5.0 4.1 4.6  NEUTROABS 8.0*  --   --   --   --   --   --   HGB 12.1*  < > 9.9* 8.3* 8.2* 8.6* 8.6*  HCT 36.0*  < > 29.3* 24.8* 24.9* 25.2* 25.5*  MCV 104.3*  < > 103.9* 104.2* 104.6* 103.7* 103.2*  PLT 250  < > 157 144* 133* 154 164  < > = values in this interval not displayed. Cardiac Enzymes:  Recent Labs Lab 09/01/14 0335 09/01/14 0625  TROPONINI 0.07* 0.06*    Recent Results  (from the past 240 hour(s))  MRSA PCR Screening     Status: None   Collection Time: 09/01/14  3:03 AM  Result Value Ref Range Status   MRSA by PCR NEGATIVE NEGATIVE Final    Comment:        The GeneXpert MRSA Assay (FDA approved for NASAL specimens only), is one component of a comprehensive MRSA colonization surveillance program. It is not intended to diagnose MRSA infection nor to guide or monitor treatment for MRSA infections.      Scheduled Meds: . folic acid  1 mg Oral Daily  . LORazepam  0-4 mg Intravenous 4 times per day   Followed by  . [START ON 09/03/2014] LORazepam  0-4 mg Intravenous Q12H  . multivitamin with minerals  1 tablet Oral Daily  . nicotine  14 mg Transdermal Daily  . sodium chloride  1,000 mL Intravenous Once  . sodium chloride  3 mL Intravenous Q12H  . thiamine  100 mg Oral Daily   Or  . thiamine  100 mg Intravenous Daily  . venlafaxine  75 mg Oral QHS  . venlafaxine XR  75 mg Oral Q breakfast   Continuous Infusions: . sodium chloride 125 mL/hr at 09/02/14 1032  . octreotide  (SANDOSTATIN)    IV infusion 50 mcg/hr (09/02/14 1031)  . pantoprozole (PROTONIX) infusion 8 mg/hr (09/02/14 1032)    Pamella Pertostin Gherghe, MD [facility] Pager [phone]. If 7 PM - 7 AM, please contact night-coverage at [email or web address], password [email or web address] 09/02/2014, 2:01 PM  LOS: 1 day

## 2014-09-02 NOTE — Progress Notes (Signed)
Pt transferred to 1427 with all belongings. Wife notified. Report given to receiving RN and all questions answered.

## 2014-09-03 LAB — CBC
HEMATOCRIT: 24.5 % — AB (ref 39.0–52.0)
HEMATOCRIT: 24.9 % — AB (ref 39.0–52.0)
HEMOGLOBIN: 8.8 g/dL — AB (ref 13.0–17.0)
Hemoglobin: 8.7 g/dL — ABNORMAL LOW (ref 13.0–17.0)
MCH: 35.4 pg — ABNORMAL HIGH (ref 26.0–34.0)
MCH: 35.2 pg — ABNORMAL HIGH (ref 26.0–34.0)
MCHC: 35.5 g/dL (ref 30.0–36.0)
MCHC: 35.3 g/dL (ref 30.0–36.0)
MCV: 99.6 fL (ref 78.0–100.0)
MCV: 99.6 fL (ref 78.0–100.0)
Platelets: 183 10*3/uL (ref 150–400)
Platelets: 195 10*3/uL (ref 150–400)
RBC: 2.46 MIL/uL — ABNORMAL LOW (ref 4.22–5.81)
RBC: 2.5 MIL/uL — ABNORMAL LOW (ref 4.22–5.81)
RDW: 11.2 % — AB (ref 11.5–15.5)
RDW: 11.3 % — AB (ref 11.5–15.5)
WBC: 5 10*3/uL (ref 4.0–10.5)
WBC: 6 10*3/uL (ref 4.0–10.5)

## 2014-09-03 LAB — GLUCOSE, CAPILLARY
GLUCOSE-CAPILLARY: 68 mg/dL — AB (ref 70–99)
Glucose-Capillary: 73 mg/dL (ref 70–99)

## 2014-09-03 MED ORDER — SODIUM CHLORIDE 0.9 % IV SOLN
8.0000 mg/h | INTRAVENOUS | Status: AC
  Administered 2014-09-03: 8 mg/h via INTRAVENOUS
  Filled 2014-09-03 (×2): qty 80

## 2014-09-03 MED ORDER — PANTOPRAZOLE SODIUM 40 MG PO TBEC
40.0000 mg | DELAYED_RELEASE_TABLET | Freq: Two times a day (BID) | ORAL | Status: DC
  Administered 2014-09-04: 40 mg via ORAL
  Filled 2014-09-03: qty 1

## 2014-09-03 NOTE — Progress Notes (Signed)
PROGRESS NOTE  Ronald RuddyDavid Diemer BJY:[id number] DOB: 06/10/1985 DOA: 09/01/2014 PCP: PROVIDER NOT IN SYSTEM  HPI: Ronald Stark is a 29 y.o. male with past medical history back [facility], tobacco abuse, alcohol abuse, substance abuse, who presents with hematemesis and dark stool  Subjective / 24 H Interval events - doing well this morning, denies further emesis - no BMs yet - tolerating clears well  Assessment/Plan: Principal Problem:   GIB (gastrointestinal bleeding) Active Problems:   Tobacco abuse   Alcohol abuse   Substance abuse   Back pain   Hematemesis   Acute blood loss anemia   Upper GI bleed - with gastric ulcer per EGD report.  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  Objective  Filed Vitals:   09/02/14 1700 09/02/14 2318 09/03/14 0538 09/03/14 1337  BP: 110/68 118/77 115/72 124/74  Pulse: 78 99 103 94  Temp: 97.8 F (36.6 C) 98 F (36.7 C) 98.9 F (37.2 C) 98.7 F (37.1 C)  TempSrc: Oral Oral Oral Oral  Resp: 12 16 18 18   Height:      Weight:      SpO2: 99% 99% 100% 98%    Intake/Output Summary (Last 24 hours) at 09/03/14 1413 Last data filed at 09/03/14  1208  Gross per 24 hour  Intake   3113 ml  Output   3225 ml  Net   -112 ml   Filed Weights   09/01/14 0300 09/02/14 0500  Weight: 62.9 kg (138 lb 10.7 oz) 63.4 kg (139 lb 12.4 oz)    Exam:  General:  NAD  HEENT: no scleral icterus  Cardiovascular: RRR without MRG, 2+ peripheral pulses, no edema  Respiratory: CTA biL, good air movement, no wheezing, no crackles, no rales  Abdomen: soft, non tender, BS +, no guarding  Data Reviewed: Basic Metabolic Panel:  Recent Labs Lab 09/01/14 0032 09/01/14 0050 09/01/14 0335  NA 141 141 142  K 4.7 4.6 4.7  CL 111 107 116*  CO2 23  --  21*  GLUCOSE 200* 193* 132*  BUN 45* 42* 41*  CREATININE 1.12 1.10 0.83  CALCIUM 9.1  --  7.9*   Liver Function Tests:  Recent Labs Lab 09/01/14 0032 09/01/14 0335  AST 62* 48*  ALT 52 43  ALKPHOS 70 53  BILITOT 1.3* 0.9  PROT 6.6 5.5*  ALBUMIN 4.3 3.7    Recent Labs Lab 09/01/14 0032  LIPASE 16*   CBC:  Recent Labs Lab 09/01/14 0032  09/02/14 0325 09/02/14 0929 09/02/14 1452 09/02/14 2125 09/03/14 0510  WBC 10.2  < > 4.1 4.6 4.4 4.6 5.0  NEUTROABS 8.0*  --   --   --   --   --   --  HGB 12.1*  < > 8.6* 8.6* 8.3* 9.1* 8.7*  HCT 36.0*  < > 25.2* 25.5* 24.5* 26.1* 24.5*  MCV 104.3*  < > 103.7* 103.2* 101.7* 100.4* 99.6  PLT 250  < > 154 164 165 176 183  < > = values in this interval not displayed. Cardiac Enzymes:  Recent Labs Lab 09/01/14 0335 09/01/14 0625  TROPONINI 0.07* 0.06*    Recent Results (from the past 240 hour(s))  MRSA PCR Screening     Status: None   Collection Time: 09/01/14  3:03 AM  Result Value Ref Range Status   MRSA by PCR NEGATIVE NEGATIVE Final    Comment:        The GeneXpert MRSA Assay (FDA approved for NASAL specimens only), is one component of a comprehensive MRSA colonization surveillance program. It is not intended to diagnose MRSA infection nor to guide or monitor treatment for MRSA infections.      Scheduled Meds: .  folic acid  1 mg Oral Daily  . LORazepam  0-4 mg Intravenous Q12H  . multivitamin with minerals  1 tablet Oral Daily  . nicotine  14 mg Transdermal Daily  . [START ON 09/04/2014] pantoprazole  40 mg Oral BID  . sodium chloride  1,000 mL Intravenous Once  . sodium chloride  3 mL Intravenous Q12H  . thiamine  100 mg Oral Daily   Or  . thiamine  100 mg Intravenous Daily  . venlafaxine XR  75 mg Oral Q breakfast   Continuous Infusions: . sodium chloride 125 mL/hr at 09/03/14 1220  . pantoprozole (PROTONIX) infusion 8 mg/hr (09/03/14 0730)    Pamella Pertostin Gherghe, MD [facility] Pager [phone]. If 7 PM - 7 AM, please contact night-coverage at [email or web address], password [email or web address] 09/03/2014, 2:13 PM  LOS: 2 days

## 2014-09-03 NOTE — Progress Notes (Signed)
[facility] PROGRESS NOTE Subjective No further gross bleeding.   Objective: Vital signs in last 24 hours: Temp:  [97.8 F (36.6 C)-98.9 F (37.2 C)] 98.9 F (37.2 C) (05/06 0538) Pulse Rate:  [71-103] 103 (05/06 0538) Resp:  [11-18] 18 (05/06 0538) BP: (110-126)/(68-92) 115/72 mmHg (05/06 0538) SpO2:  [99 %-100 %] 100 % (05/06 0538) Last BM Date: 09/02/14  Intake/Output from previous day: 05/05 0701 - 05/06 0700 In: 4089.5 [P.O.:600; I.V.:3489.5] Out: 3400 [Urine:3400] Intake/Output this shift:    PE: General--NAD  Abdomen--  Lab Results:  Recent Labs  09/02/14 0325 09/02/14 0929 09/02/14 1452 09/02/14 2125 09/03/14 0510  WBC 4.1 4.6 4.4 4.6 5.0  HGB 8.6* 8.6* 8.3* 9.1* 8.7*  HCT 25.2* 25.5* 24.5* 26.1* 24.5*  PLT 154 164 165 176 183   BMET  Recent Labs  09/01/14 0032 09/01/14 0050 09/01/14 0335  NA 141 141 142  K 4.7 4.6 4.7  CL 111 107 116*  CO2 23  --  21*  CREATININE 1.12 1.10 0.83   LFT  Recent Labs  09/01/14 0032 09/01/14 0335  PROT 6.6 5.5*  AST 62* 48*  ALT 52 43  ALKPHOS 70 53  BILITOT 1.3* 0.9   PT/INR  Recent Labs  09/01/14 0032  LABPROT 14.0  INR 1.07   PANCREAS  Recent Labs  09/01/14 0032  LIPASE 16*         Studies/Results: No results found.  Medications: I have reviewed the patient's current medications.  Assessment/Plan: 1. Bleeding GU. Appears stable will advance diet and change to PO PPI and ? Home tomorrow if Hg is stable.   Ashna Dorough JR,Milyn Stapleton L 09/03/2014, 8:28 AM  Pager: [phone] If no answer or after hours call [phone]

## 2014-09-03 NOTE — Progress Notes (Signed)
Hypoglycemic Event  CBG: 68  Treatment: 15 GM carbohydrate snack  Symptoms: None  Follow-up CBG: Time:0475 CBG Result:73  Possible Reasons for Event: Inadequate meal intake  Comments/MD notified:n/a    Ronald Stark, Ronald Stark  Remember to initiate Hypoglycemia Order Set & complete

## 2014-09-04 DIAGNOSIS — Z720 Tobacco use: Secondary | ICD-10-CM

## 2014-09-04 LAB — GLUCOSE, CAPILLARY: GLUCOSE-CAPILLARY: 92 mg/dL (ref 70–99)

## 2014-09-04 LAB — CBC
HCT: 23.9 % — ABNORMAL LOW (ref 39.0–52.0)
Hemoglobin: 8.3 g/dL — ABNORMAL LOW (ref 13.0–17.0)
MCH: 34.4 pg — AB (ref 26.0–34.0)
MCHC: 34.7 g/dL (ref 30.0–36.0)
MCV: 99.2 fL (ref 78.0–100.0)
PLATELETS: 179 10*3/uL (ref 150–400)
RBC: 2.41 MIL/uL — ABNORMAL LOW (ref 4.22–5.81)
RDW: 11.4 % — AB (ref 11.5–15.5)
WBC: 4.9 10*3/uL (ref 4.0–10.5)

## 2014-09-04 MED ORDER — OXYCODONE-ACETAMINOPHEN 7.5-325 MG PO TABS: 1.0000 | ORAL_TABLET | Freq: Three times a day (TID) | ORAL | Status: DC | PRN

## 2014-09-04 MED ORDER — NICOTINE 14 MG/24HR TD PT24: 14.0000 mg | MEDICATED_PATCH | Freq: Every day | TRANSDERMAL | Status: AC

## 2014-09-04 MED ORDER — PANTOPRAZOLE SODIUM 40 MG PO TBEC: 40.0000 mg | DELAYED_RELEASE_TABLET | Freq: Two times a day (BID) | ORAL | Status: DC

## 2014-09-04 MED ORDER — PANTOPRAZOLE SODIUM 40 MG PO TBEC: 40.0000 mg | DELAYED_RELEASE_TABLET | Freq: Two times a day (BID) | ORAL | Status: AC

## 2014-09-04 NOTE — Progress Notes (Addendum)
Patient HR in the 130-140's for about 10 minutes.  VSS. Patient asymptomatic. NP on call made aware. No new orders placed. Tele strip saved in chart. Will continue to monitor closely.

## 2014-09-04 NOTE — Discharge Summary (Signed)
Physician Discharge Summary  Ronald RuddyDavid Stark ZOX:[id number] DOB: 11/22/1985 DOA: 09/01/2014  PCP: PROVIDER NOT IN SYSTEM  Admit date: 09/01/2014 Discharge date: 09/04/2014  Time spent: >30 minutes  Recommendations for Outpatient Follow-up:  1. Follow up [facility] Dr[facility] Venetia NightAmao in 1 week [facility] 2. Follow up with Dr. Bosie ClosSchooler in 2 weeks  Discharge Diagnoses:  Principal Problem:   GIB (gastrointestinal bleeding) Active Problems:   Tobacco abuse   Alcohol abuse   Substance abuse   Back pain   Hematemesis   Acute blood loss anemia  Discharge Condition: stable  Diet recommendation: soft, regular  Filed Weights   09/01/14 0300 09/02/14 0500  Weight: 62.9 kg (138 lb 10.7 oz) 63.4 kg (139 lb 12.4 oz)    History of present illness:  Ronald RuddyDavid Stark is a 29 y.o. male with past medical history back [facility], tobacco abuse, alcohol abuse, substance abuse, who presents with hematemesis and dark stool Patient has a chronic back pain, and is taking ibuprofen frequently for chronic pain. Per his wife, patient drinks 4-6 drinks daily. He also uses valium for anxiety and Percocet for back pain. Patient started having hematemesis at about 2 PM yesterday, he had vomited dark blood for 7-8 times, which is in large volume per his wife. He also has dark stools. Patient is experiencing diaphoresis, tremors and generalized fatigue. His wife states the patient has not eaten or drank any fluids today. He has mild abdominal pain, mild SOB, but no chest pain. Initially patient was found to be hypotension which responded to IVF. His blood pressure is 105/92 when I saw patient in ED. In ED, patient was found to have tachycardia, hemoglobin 12.1, electrolytes okay, lactate 2.87, INR 1.07, lipase negative. negative chest x-ray. PCCM was consulted. Dr. Sung AmabileSimonds recommended to admit to [facility]. GI was consulted by ED. Patient is admitted to inpatient for further evaluation  and treatment. IV octreotide and Protonix were started in ED. 2 units of blood were ordered by ED.  Hospital Course:  Upper GI bleed - patient was admitted to the hospital with hematemesis in the setting of a probable upper GI bleed. Patient was started on a PPI drip and gastroenterology was consulted. He had an EGD on the night of admission, which revealed large amount of dark red and black fluid in the dependent portion of the stomach as well as an adherent clot with surrounding edema and erythema in the distal stomach with a small part of the ulcer visible below the clot. Patient's hemoglobin dropped from 12 to 8 on admission and has remained stable since without needs for blood transfusion. He was kept NPO for 48 hours and his diet was slowly advanced. Clinically he continued to improve, was tolerating a diet and on the day prior to discharge had a normal BM without blood / melena. He was discharged home in stable condition with close outpatient follow up. I discussed extensively with the patient regarding ETOH and Tobacco abstinence as well as to avoid all NSAIDs. He was discharged on PPI BID for 30 days then once daily.  Acute blood loss anemia - due to #1, stable. Repeat CBC in a week as an outpatient.  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, hold NSAIDs Polysubstance abuse - including alcohol, tobacco and marijuana  Procedures:  EGD 5/4 Medium-sized pylori channel ulcer with adherent clot without active bleeding seen(see above) Antral gastritis   Consultations:  GI  Discharge Exam:  Filed Vitals:   09/03/14 1337 09/03/14 2152 09/04/14 0327 09/04/14 0534  BP: 124/74 117/69 126/82 132/89  Pulse: 94 82 100 96  Temp: 98.7 F (37.1 C) 97.7 F (36.5 C) 98.4 F (36.9 C) 98.1 F (36.7 C)  TempSrc: Oral Axillary Oral Oral  Resp: Height:      Weight:      SpO2: 98% 99% 100% 100%   General: NAD Cardiovascular: RRR Respiratory: CTA biL  Discharge Instructions     Medication  List    STOP taking these medications        ibuprofen 200 MG tablet  Commonly known as:  ADVIL,MOTRIN      TAKE these medications        diazepam 5 MG tablet  Commonly known as:  VALIUM  Take 5 mg by mouth every 6 (six) hours as needed for anxiety (anxiety).     diphenhydrAMINE 25 MG tablet  Commonly known as:  BENADRYL  Take 25 mg by mouth every 6 (six) hours as needed for allergies (allergies).     nicotine 14 mg/24hr patch  Commonly known as:  NICODERM CQ - dosed in mg/24 hours  Place 1 patch (14 mg total) onto the skin daily. Can dispense generic     oxyCODONE-acetaminophen 7.5-325 MG per tablet  Commonly known as:  PERCOCET  Take 1 tablet by mouth every 8 (eight) hours as needed for severe pain.     pantoprazole 40 MG tablet  Commonly known as:  PROTONIX  Take 1 tablet (40 mg total) by mouth 2 (two) times daily. After 1 month take once daily     venlafaxine XR 75 MG 24 hr capsule  Commonly known as:  EFFEXOR-XR  Take 75 mg by mouth daily with breakfast.           Follow-up Information    Follow up with [facility]     On 09/08/2014.   Why:  Appointment at 10 AM, arrive before at 945 AM. Please take Photo ID, discharge paper and co pay with you.    Contact information:   [address] [place] [address] [phone]      Follow up with Shirley Friar., MD. Schedule an appointment as soon as possible for a visit in 2 weeks.   Specialty:  Gastroenterology   Contact information:   [address] [place] [address] [phone]       The results of significant diagnostics from this hospitalization (including imaging, microbiology, ancillary and laboratory) are listed below for reference.    Significant Diagnostic Studies: Dg Chest Port 1 View  09/01/2014   CLINICAL DATA:  Vomiting blood.  Lethargic  EXAM: PORTABLE CHEST - 1 VIEW  COMPARISON:  Radiograph 07/29/2014  FINDINGS:  Normal mediastinum and cardiac silhouette. Normal pulmonary vasculature. No evidence of effusion, infiltrate, or pneumothorax. No acute bony abnormality.  IMPRESSION: No acute cardiopulmonary process.   Electronically Signed   By: Genevive Bi M.D.   On: 09/01/2014 00:46    Microbiology: Recent Results (from the past 240 hour(s))  MRSA PCR Screening     Status: None   Collection Time: 09/01/14  3:03 AM  Result Value Ref Range Status   MRSA by PCR NEGATIVE NEGATIVE Final    Comment:        The GeneXpert MRSA Assay (FDA approved for NASAL specimens only), is one component of a comprehensive MRSA colonization surveillance program.  It is not intended to diagnose MRSA infection nor to guide or monitor treatment for MRSA infections.      Labs: Basic Metabolic Panel:  Recent Labs Lab 09/01/14 0032 09/01/14 0050 09/01/14 0335  NA 141 141 142  K 4.7 4.6 4.7  CL 111 107 116*  CO2 23  --  21*  GLUCOSE 200* 193* 132*  BUN 45* 42* 41*  CREATININE 1.12 1.10 0.83  CALCIUM 9.1  --  7.9*   Liver Function Tests:  Recent Labs Lab 09/01/14 0032 09/01/14 0335  AST 62* 48*  ALT 52 43  ALKPHOS 70 53  BILITOT 1.3* 0.9  PROT 6.6 5.5*  ALBUMIN 4.3 3.7    Recent Labs Lab 09/01/14 0032  LIPASE 16*   CBC:  Recent Labs Lab 09/01/14 0032  09/02/14 1452 09/02/14 2125 09/03/14 0510 09/03/14 1848 09/04/14 0513  WBC 10.2  < > 4.4 4.6 5.0 6.0 4.9  NEUTROABS 8.0*  --   --   --   --   --   --   HGB 12.1*  < > 8.3* 9.1* 8.7* 8.8* 8.3*  HCT 36.0*  < > 24.5* 26.1* 24.5* 24.9* 23.9*  MCV 104.3*  < > 101.7* 100.4* 99.6 99.6 99.2  PLT 250  < > 165 176 183 195 179  < > = values in this interval not displayed. Cardiac Enzymes:  Recent Labs Lab 09/01/14 0335 09/01/14 0625  TROPONINI 0.07* 0.06*   CBG:  Recent Labs Lab 09/01/14 0738 09/02/14 0747 09/03/14 0723 09/03/14 0745 09/04/14 0745  GLUCAP 123* 97 68* 73 92     Signed:  Donaven Criswell  [facility] 09/04/2014, 3:52 PM

## 2014-09-04 NOTE — Clinical Social Work Note (Signed)
CSW reviewed weekend consults and pt had referral for polysubstance abuse assessment  CSW reviewed pt record at this time and it appears that pt had been discharged this morning so CSW assessment could not be completed.  Elray Buba.Celeste Candelas, LCSW [facility] Clinical Social Worker - Weekend Coverage cell #: [phone]

## 2014-09-04 NOTE — Discharge Instructions (Signed)
Follow with Dr. Bosie ClosSchooler in 2 weeks   Please get a complete blood count and chemistry panel checked by your Primary MD at your next visit, and again as instructed by your Primary MD. Please get your medications reviewed and adjusted by your Primary MD.  Please request your Primary MD to go over all Hospital Tests and Procedure/Radiological results at the follow up, please get all Hospital records sent to your Prim MD by signing hospital release before you go home.  If you had Pneumonia of Lung problems at the Hospital: Please get a 2 view Chest X ray done in 6-8 weeks after hospital discharge or sooner if instructed by your Primary MD.  If you have Congestive Heart Failure: Please call your Cardiologist or Primary MD anytime you have any of the following symptoms:  1) 3 pound weight gain in 24 hours or 5 pounds in 1 week  2) shortness of breath, with or without a dry hacking cough  3) swelling in the hands, feet or stomach  4) if you have to sleep on extra pillows at night in order to breathe  Follow cardiac low salt diet and 1.5 lit/day fluid restriction.  If you have diabetes Accuchecks 4 times/day, Once in AM empty stomach and then before each meal. Log in all results and show them to your primary doctor at your next visit. If any glucose reading is under 80 or above 300 call your primary MD immediately.  If you have Seizure/Convulsions/Epilepsy: Please do not drive, operate heavy machinery, participate in activities at heights or participate in high speed sports until you have seen by Primary MD or a Neurologist and advised to do so again.  If you had Gastrointestinal Bleeding: Please ask your Primary MD to check a complete blood count within one week of discharge or at your next visit. Your endoscopic/colonoscopic biopsies that are pending at the time of discharge, will also need to followed by your Primary MD.  Get Medicines reviewed and adjusted. Please take all your medications  with you for your next visit with your Primary MD  Please request your Primary MD to go over all hospital tests and procedure/radiological results at the follow up, please ask your Primary MD to get all Hospital records sent to his/her office.  If you experience worsening of your admission symptoms, develop shortness of breath, life threatening emergency, suicidal or homicidal thoughts you must seek medical attention immediately by calling 911 or calling your MD immediately  if symptoms less severe.  You must read complete instructions/literature along with all the possible adverse reactions/side effects for all the Medicines you take and that have been prescribed to you. Take any new Medicines after you have completely understood and accpet all the possible adverse reactions/side effects.   Do not drive or operate heavy machinery when taking Pain medications.   Do not take more than prescribed Pain, Sleep and Anxiety Medications  Special Instructions: If you have smoked or chewed Tobacco  in the last 2 yrs please stop smoking, stop any regular Alcohol  and or any Recreational drug use.  Wear Seat belts while driving.  Please note You were cared for by a hospitalist during your hospital stay. If you have any questions about your discharge medications or the care you received while you were in the hospital after you are discharged, you can call the unit and asked to speak with the hospitalist on call if the hospitalist that took care of you is not available. Once  you are discharged, your primary care physician will handle any further medical issues. Please note that NO REFILLS for any discharge medications will be authorized once you are discharged, as it is imperative that you return to your primary care physician (or establish a relationship with a primary care physician if you do not have one) for your aftercare needs so that they can reassess your need for medications and monitor your lab  values.  You can reach the hospitalist office at phone [phone] or fax [phone]   If you do not have a primary care physician, you can call [phone] for a physician referral.  Activity: As tolerated with Full fall precautions use walker/cane & assistance as needed  Diet: soft  Disposition Home

## 2014-09-04 NOTE — Progress Notes (Signed)
Reviewed discharge information with patient and wife. Answered all questions. Patient/spouse egiver able to teach back medications and reasons to contact MD/911. Patient verbalizes importance of PCP follow up appointment and to schedule appt with GI (dr Bosie ClosSchooler).

## 2014-09-05 LAB — TYPE AND SCREEN
ABO/RH(D): O POS
ANTIBODY SCREEN: NEGATIVE
UNIT DIVISION: 0
UNIT DIVISION: 0
Unit division: 0
Unit division: 0

## 2014-09-08 ENCOUNTER — Ambulatory Visit: Payer: 59 | Attending: Family Medicine | Admitting: Family Medicine

## 2014-09-08 ENCOUNTER — Encounter: Payer: Self-pay | Admitting: Family Medicine

## 2014-09-08 ENCOUNTER — Telehealth: Payer: Self-pay

## 2014-09-08 VITALS — BP 130/83 | HR 98 | Temp 98.2°F | Resp 18 | Ht 71.0 in | Wt 142.0 lb

## 2014-09-08 DIAGNOSIS — K254 Chronic or unspecified gastric ulcer with hemorrhage: Secondary | ICD-10-CM | POA: Insufficient documentation

## 2014-09-08 DIAGNOSIS — D62 Acute posthemorrhagic anemia: Secondary | ICD-10-CM | POA: Diagnosis not present

## 2014-09-08 DIAGNOSIS — K5909 Other constipation: Secondary | ICD-10-CM | POA: Diagnosis not present

## 2014-09-08 DIAGNOSIS — F418 Other specified anxiety disorders: Secondary | ICD-10-CM | POA: Insufficient documentation

## 2014-09-08 DIAGNOSIS — F419 Anxiety disorder, unspecified: Secondary | ICD-10-CM

## 2014-09-08 DIAGNOSIS — Q7649 Other congenital malformations of spine, not associated with scoliosis: Secondary | ICD-10-CM

## 2014-09-08 DIAGNOSIS — F329 Major depressive disorder, single episode, unspecified: Secondary | ICD-10-CM

## 2014-09-08 DIAGNOSIS — Q763 Congenital scoliosis due to congenital bony malformation: Secondary | ICD-10-CM | POA: Diagnosis not present

## 2014-09-08 LAB — CBC WITH DIFFERENTIAL/PLATELET
Basophils Absolute: 0 10*3/uL (ref 0.0–0.1)
Basophils Relative: 1 % (ref 0–1)
EOS ABS: 0.2 10*3/uL (ref 0.0–0.7)
Eosinophils Relative: 5 % (ref 0–5)
HCT: 25.1 % — ABNORMAL LOW (ref 39.0–52.0)
HEMOGLOBIN: 8.6 g/dL — AB (ref 13.0–17.0)
LYMPHS ABS: 1.6 10*3/uL (ref 0.7–4.0)
LYMPHS PCT: 38 % (ref 12–46)
MCH: 34.4 pg — AB (ref 26.0–34.0)
MCHC: 34.3 g/dL (ref 30.0–36.0)
MCV: 100.4 fL — ABNORMAL HIGH (ref 78.0–100.0)
MONO ABS: 0.8 10*3/uL (ref 0.1–1.0)
MPV: 9.8 fL (ref 8.6–12.4)
Monocytes Relative: 18 % — ABNORMAL HIGH (ref 3–12)
Neutro Abs: 1.6 10*3/uL — ABNORMAL LOW (ref 1.7–7.7)
Neutrophils Relative %: 38 % — ABNORMAL LOW (ref 43–77)
Platelets: 391 10*3/uL (ref 150–400)
RBC: 2.5 MIL/uL — AB (ref 4.22–5.81)
RDW: 12.5 % (ref 11.5–15.5)
WBC: 4.2 10*3/uL (ref 4.0–10.5)

## 2014-09-08 MED ORDER — SUCRALFATE 1 G PO TABS: 1.0000 g | ORAL_TABLET | Freq: Three times a day (TID) | ORAL | Status: AC

## 2014-09-08 NOTE — Progress Notes (Signed)
Patient hospitalized for stomach ulcer. Patient reports feeling better. No vomiting or diarrhea. Patient was constipated until yesterday. Patient reports abdominal pain at level 4, described as tender. Patient reports lower back pain, at level 6, described aching. Patient has Bertolotti's syndrome.

## 2014-09-08 NOTE — Patient Instructions (Signed)
Food Choices for Peptic Ulcer Disease  When you have peptic ulcer disease, the foods you eat and your eating habits are very important. Choosing the right foods can help ease the discomfort of peptic ulcer disease.  WHAT GENERAL GUIDELINES DO I NEED TO FOLLOW?  · Choose fruits, vegetables, whole grains, and low-fat meat, fish, and poultry.    · Keep a food diary to identify foods that cause symptoms.  · Avoid foods that cause irritation or pain. These may be different for different people.  · Eat frequent small meals instead of three large meals each day. The pain may be worse when your stomach is empty.   · Avoid eating close to bedtime.  WHAT FOODS ARE NOT RECOMMENDED?  The following are some foods and drinks that may worsen your symptoms:  · Black, white, and red pepper.  · Hot sauce.  · Chili peppers.  · Chili powder.  · Chocolate and cocoa.     · Alcohol.  · Tea, coffee, and cola (regular and decaffeinated).  The items listed above may not be a complete list of foods and beverages to avoid. Contact your dietitian for more information.  Document Released: 07/09/2011 Document Revised: 04/21/2013 Document Reviewed: 02/18/2013  [employer]® Patient Information ©2015 [employer]. This information is not intended to replace advice given to you by your health care provider. Make sure you discuss any questions you have with your health care provider.

## 2014-09-08 NOTE — Progress Notes (Signed)
Subjective:    Patient ID: Ronald Stark, male    DOB: 02/19/1986, 29 y.o.   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  On presentation his blood pressure was 105/92 he was found to have tachycardia, hemoglobin 12.1, lactate 2.87, INR 1.07, lipase negative. negative chest x-ray. PCCM was consulted. Dr. Sung AmabileSimonds recommended to admit to stepdown unit. GI was consulted by ED. IV octreotide and Protonix were started, 2 units of blood were ordered by ED.  Patient was admitted to the hospital with hematemesis in the setting of a probable upper GI bleed. Patient was started on a PPI drip he had an EGD on the night of admission, which revealed large amount of dark red and black fluid in the dependent portion of the stomach as well as an adherent clot with surrounding edema and erythema in the distal stomach with a small part of the ulcer visible below the clot. Patient's hemoglobin dropped from 12 to 8 on admission and has remained stable since without needs for blood transfusion. He was kept NPO for 48 hours and his diet was slowly advanced. Clinically he continued to improve, was tolerating a diet and on the day prior to discharge had a normal BM without blood / melena. He was discharged home in stable condition with close outpatient follow up. He was discharged on PPI BID for 30 days then once daily.    Interval  History:  He reports symptoms have improved but he still has some epigastric cramping. He denies any nausea, vomiting, melena or hematochezia. He does admit to some constipation which resolves with the use of laxatives and fruit intake. He received some  Percocet from his hospitalization but will be running out in the next 1 week. Also has a history of anxiety and depression and is doing well on his medications and was given Valium by his previous PCP for anxiety as needed.      Past Medical History  Diagnosis Date  . Anxiety   . Tobacco abuse   . Alcohol abuse   . Substance abuse   . Bertolotti's syndrome   . Bertolotti's syndrome 03/2014    Past Surgical History  Procedure Laterality Date  . Back surgery    . Esophagogastroduodenoscopy N/A 09/01/2014    Procedure: ESOPHAGOGASTRODUODENOSCOPY (EGD);  Surgeon: Charlott RakesVincent Schooler, MD;  Location: [facility];  Service: Endoscopy;  Laterality: N/A;    History   Social History  . Marital Status: Married    Spouse Name: N/A  . Number of Children: N/A  . Years of Education: N/A   Occupational History  . Not on file.   Social History Main Topics  . Smoking status: Current Every Day Smoker -- 1.00 packs/day    Types: Cigarettes  . Smokeless tobacco: Not on file  . Alcohol Use: Yes     Comment: daily; last drink 08/31/14  . Drug Use: Yes    Special: Marijuana     Comment: marijuana last used 09/01/14, occassional use  . Sexual Activity: Not on file   Other Topics Concern  . Not on file   Social History Narrative    No Known Allergies  Current Outpatient  Prescriptions on File Prior to Visit  Medication Sig Dispense Refill  . diazepam (VALIUM) 5 MG tablet Take 5 mg by mouth every 6 (six) hours as needed for anxiety (anxiety).    . nicotine (NICODERM CQ - DOSED IN MG/24 HOURS) 14 mg/24hr patch Place 1 patch (14 mg total) onto the skin daily. Can dispense generic 28 patch 0  . oxyCODONE-acetaminophen (PERCOCET) 7.5-325 MG per tablet Take 1 tablet by mouth every 8 (eight) hours as needed for severe pain. 25 tablet 0  . pantoprazole (PROTONIX) 40 MG tablet Take 1 tablet (40 mg total) by mouth 2 (two) times daily. After 1 month take once daily 90 tablet 1  . venlafaxine XR  (EFFEXOR-XR) 75 MG 24 hr capsule Take 75 mg by mouth daily with breakfast.     No current facility-administered medications on file prior to visit.     Review of Systems  Constitutional: Negative for activity change and appetite change.  HENT: Negative for sinus pressure and sore throat.   Eyes: Negative for visual disturbance.  Respiratory: Negative for chest tightness and shortness of breath.   Cardiovascular: Negative for chest pain and palpitations.  Gastrointestinal: Positive for abdominal pain and constipation. Negative for abdominal distention.  Endocrine: Negative for cold intolerance, heat intolerance and polyphagia.  Genitourinary: Negative for dysuria, frequency and difficulty urinating.  Musculoskeletal: Positive for back pain. Negative for joint swelling and arthralgias.  Skin: Negative for color change.  Neurological: Negative for dizziness, tremors and weakness.  Psychiatric/Behavioral: Negative for suicidal ideas and behavioral problems.         Objective: Filed Vitals:   09/08/14 0959  BP: 130/83  Pulse: 98  Temp: 98.2 F (36.8 C)  Resp: 18      Physical Exam  Constitutional: He is oriented to person, place, and time. He appears well-developed and well-nourished.  HENT:  Head: Normocephalic and atraumatic.  Right Ear: External ear normal.  Left Ear: External ear normal.  Eyes: Conjunctivae and EOM are normal. Pupils are equal, round, and reactive to light.  Neck: Normal range of motion. Neck supple. No tracheal deviation present.  Cardiovascular: Normal rate, regular rhythm and normal heart sounds.   No murmur heard. Pulmonary/Chest: Effort normal and breath sounds normal. No respiratory distress. He has no wheezes. He exhibits no tenderness.  Abdominal: Soft. Bowel sounds are normal. He exhibits no mass. There is tenderness.  Mild epigastric tenderness.  Musculoskeletal: Normal range of motion. He exhibits tenderness. He exhibits no edema.  Mild  tenderness in lumbar spine.  Neurological: He is alert and oriented to person, place, and time.  Skin: Skin is warm and dry.  Psychiatric: He has a normal mood and affect.     CBC Latest Ref Rng 09/04/2014 09/03/2014 09/03/2014  WBC 4.0 - 10.5 K/uL 4.9 6.0 5.0  Hemoglobin 13.0 - 17.0 g/dL 8.3(L) 8.8(L) 8.7(L)  Hematocrit 39.0 - 52.0 % 23.9(L) 24.9(L) 24.5(L)  Platelets 150 - 400 K/uL 179 195 183           Assessment & Plan:  29 year old male patient with a history of chronic back pain recently hospitalized for upper GI bleed secondary to gastric ulcer.  Gastric ulcer: Continue pantoprazole. Patient is supposed to call GI to obtain an appointment with Dr.Schooler. I would add Sucralfate to his regimen. CBC today.  Depression and anxiety: Controlled, continue medications.  Constipation: Secondary to opioid use. Advised to use laxatives to modify diet to increase fiber intake.  Chronic back pain from Bertolotti's syndrome:  Referred to pain management. I have informed him that we do not do chronic pain management and he could only receive tramadol and Tylenol with Codeine from the clinic but he says he is able to take tramadol. He currently has some Percocet left and he will be coming in to establish care in one week at which time he could receive some Tylenol with Codeine while his referral is pending.   Disclaimer: This note was dictated with voice recognition software. Similar sounding words can inadvertently be transcribed and this note may contain transcription errors which may not have been corrected upon publication of note.

## 2014-09-08 NOTE — Telephone Encounter (Signed)
Nurse called patient to remind him to sign records release when he returns for appointment to [facility].

## 2014-09-09 ENCOUNTER — Telehealth: Payer: Self-pay

## 2014-09-09 LAB — COMPREHENSIVE METABOLIC PANEL
ALK PHOS: 58 U/L (ref 39–117)
ALT: 26 U/L (ref 0–53)
AST: 23 U/L (ref 0–37)
Albumin: 4 g/dL (ref 3.5–5.2)
BUN: 11 mg/dL (ref 6–23)
CALCIUM: 9.3 mg/dL (ref 8.4–10.5)
CO2: 32 meq/L (ref 19–32)
Chloride: 106 mEq/L (ref 96–112)
Creat: 0.92 mg/dL (ref 0.50–1.35)
Glucose, Bld: 93 mg/dL (ref 70–99)
Potassium: 4 mEq/L (ref 3.5–5.3)
Sodium: 142 mEq/L (ref 135–145)
Total Bilirubin: 0.3 mg/dL (ref 0.2–1.2)
Total Protein: 6 g/dL (ref 6.0–8.3)

## 2014-09-09 NOTE — Telephone Encounter (Signed)
Nurse called patient, patient verified date of birth. Patient aware of some improvement in labs, still anemic, and keep GI appointment. Patient has called GI for appointment, waiting for call back from them to confirm appointment, secretary had to speak with doctor before making appointment. Patient has picked up medication at pharmacy. Patient asked result of hemoglobin. Nurse informed patient of hemoglobin of 8.6. Patient aware of result and voices understanding of all information included in this message.

## 2014-09-09 NOTE — Telephone Encounter (Signed)
-----   Message from Jaclyn ShaggyEnobong Amao, MD sent at 09/09/2014  9:28 AM EDT ----- Please inform him that his labs show some improvement; he is still anemic though and I would need him to keep his appointment with the GI specialist.

## 2014-09-16 ENCOUNTER — Ambulatory Visit: Payer: 59 | Attending: Family Medicine | Admitting: Family Medicine

## 2014-09-16 ENCOUNTER — Encounter: Payer: Self-pay | Admitting: Family Medicine

## 2014-09-16 VITALS — BP 116/78 | HR 113 | Temp 98.9°F | Resp 16 | Ht 71.0 in | Wt 141.0 lb

## 2014-09-16 DIAGNOSIS — F1010 Alcohol abuse, uncomplicated: Secondary | ICD-10-CM | POA: Diagnosis not present

## 2014-09-16 DIAGNOSIS — F329 Major depressive disorder, single episode, unspecified: Secondary | ICD-10-CM | POA: Diagnosis not present

## 2014-09-16 DIAGNOSIS — K922 Gastrointestinal hemorrhage, unspecified: Secondary | ICD-10-CM | POA: Insufficient documentation

## 2014-09-16 DIAGNOSIS — Q7649 Other congenital malformations of spine, not associated with scoliosis: Secondary | ICD-10-CM | POA: Insufficient documentation

## 2014-09-16 DIAGNOSIS — F418 Other specified anxiety disorders: Secondary | ICD-10-CM

## 2014-09-16 DIAGNOSIS — F419 Anxiety disorder, unspecified: Secondary | ICD-10-CM | POA: Diagnosis not present

## 2014-09-16 DIAGNOSIS — Z87891 Personal history of nicotine dependence: Secondary | ICD-10-CM | POA: Diagnosis not present

## 2014-09-16 DIAGNOSIS — Q763 Congenital scoliosis due to congenital bony malformation: Secondary | ICD-10-CM | POA: Diagnosis not present

## 2014-09-16 DIAGNOSIS — K254 Chronic or unspecified gastric ulcer with hemorrhage: Secondary | ICD-10-CM | POA: Diagnosis not present

## 2014-09-16 MED ORDER — ACETAMINOPHEN-CODEINE #3 300-30 MG PO TABS: 1.0000 | ORAL_TABLET | Freq: Three times a day (TID) | ORAL | Status: AC | PRN

## 2014-09-16 NOTE — Progress Notes (Signed)
   Subjective:    Patient ID: Ronald Stark, male    DOB: 03/22/1986, 29 y.o.   MRN: [id number] CC: meet PCP, f/u chronic pain, chronic anxiety  HPI 29 yo [place]. Has hx of ETOH abuse.  1. Chronic low back pain: reportedly due to genetic disorder, Bertolotti's syndrome. Patient is s/p epidurals, lumbar steroid injections and surgery and still reports significant daily pain. Taking oxycodone or Vicodin for pain. He reports tramadol causes nausea. He reports tylenol #3 does not help.  He is aware of the clinic prescribing practices.   2.  Anxiety: takes valium prn. Reports it was prescribed by his PCP in [place]. Taking effexor as well. Requesting valium refill. Not amenable to counseling services for anxiety.   3. GIB: upper and lower GI bleed in setting of ETOH abuse. Reports no recent bleeding. Has a new patient appt with GI scheduled for next month. Taking carafate and protonix.   Soc Hx: former smoker, ETOH abuse  Review of Systems  Constitutional: Negative for fever and chills.  Gastrointestinal: Negative for nausea and vomiting.  Musculoskeletal: Positive for back pain.       Objective:   Physical Exam BP 116/78 mmHg  Pulse 113  Temp(Src) 98.9 F (37.2 C) (Oral)  Resp 16  Ht 5\' 11"  (1.803 m)  Wt 141 lb (63.957 kg)  BMI 19.67 kg/m2  SpO2 99% General appearance: alert, cooperative and no distress Lungs: normal WOB  Psych: mood calm. Thought process normal.      Assessment & Plan:

## 2014-09-16 NOTE — Assessment & Plan Note (Signed)
A; chronic low back pain P: Tylenol #3 offered and filled Increasing effexor dose offered and declined Patient cannot take NSAID due to GI bleed PM&R referral in place

## 2014-09-16 NOTE — Assessment & Plan Note (Signed)
A: stable per patient P:  Continue effexor Did not refill valium per prescribing practices advising against opiod pain medicine and BZ at the same time also because the use of BZ for chronic anxiety is not standard of care

## 2014-09-16 NOTE — Patient Instructions (Addendum)
Mr. Ronald Stark,   Thank you for coming in today. Please inquire with your insurance company about in network primary care providers.  I have ordered tylenol #3  F/u in 3 months for chronic back pain related to Bertolotti syndrome   Dr. Armen PickupFunches

## 2014-09-16 NOTE — Assessment & Plan Note (Signed)
A: stable w/o evidence of active bleeding P: GI referral in place Patient avoiding ETOH

## 2014-09-16 NOTE — Progress Notes (Signed)
Establish Care Chronic low back pain  Pain possible from generic disorder

## 2015-11-20 IMAGING — CR DG CHEST 2V
2 series · 2 of 2 positions shown · non-contrast
Comparison: None.

CLINICAL DATA: Insect bite to the right side of the ribcage. Blurry
vision started this morning. Ten [DATE] pain.

EXAM:
CHEST  2 VIEW

[w chest pa]
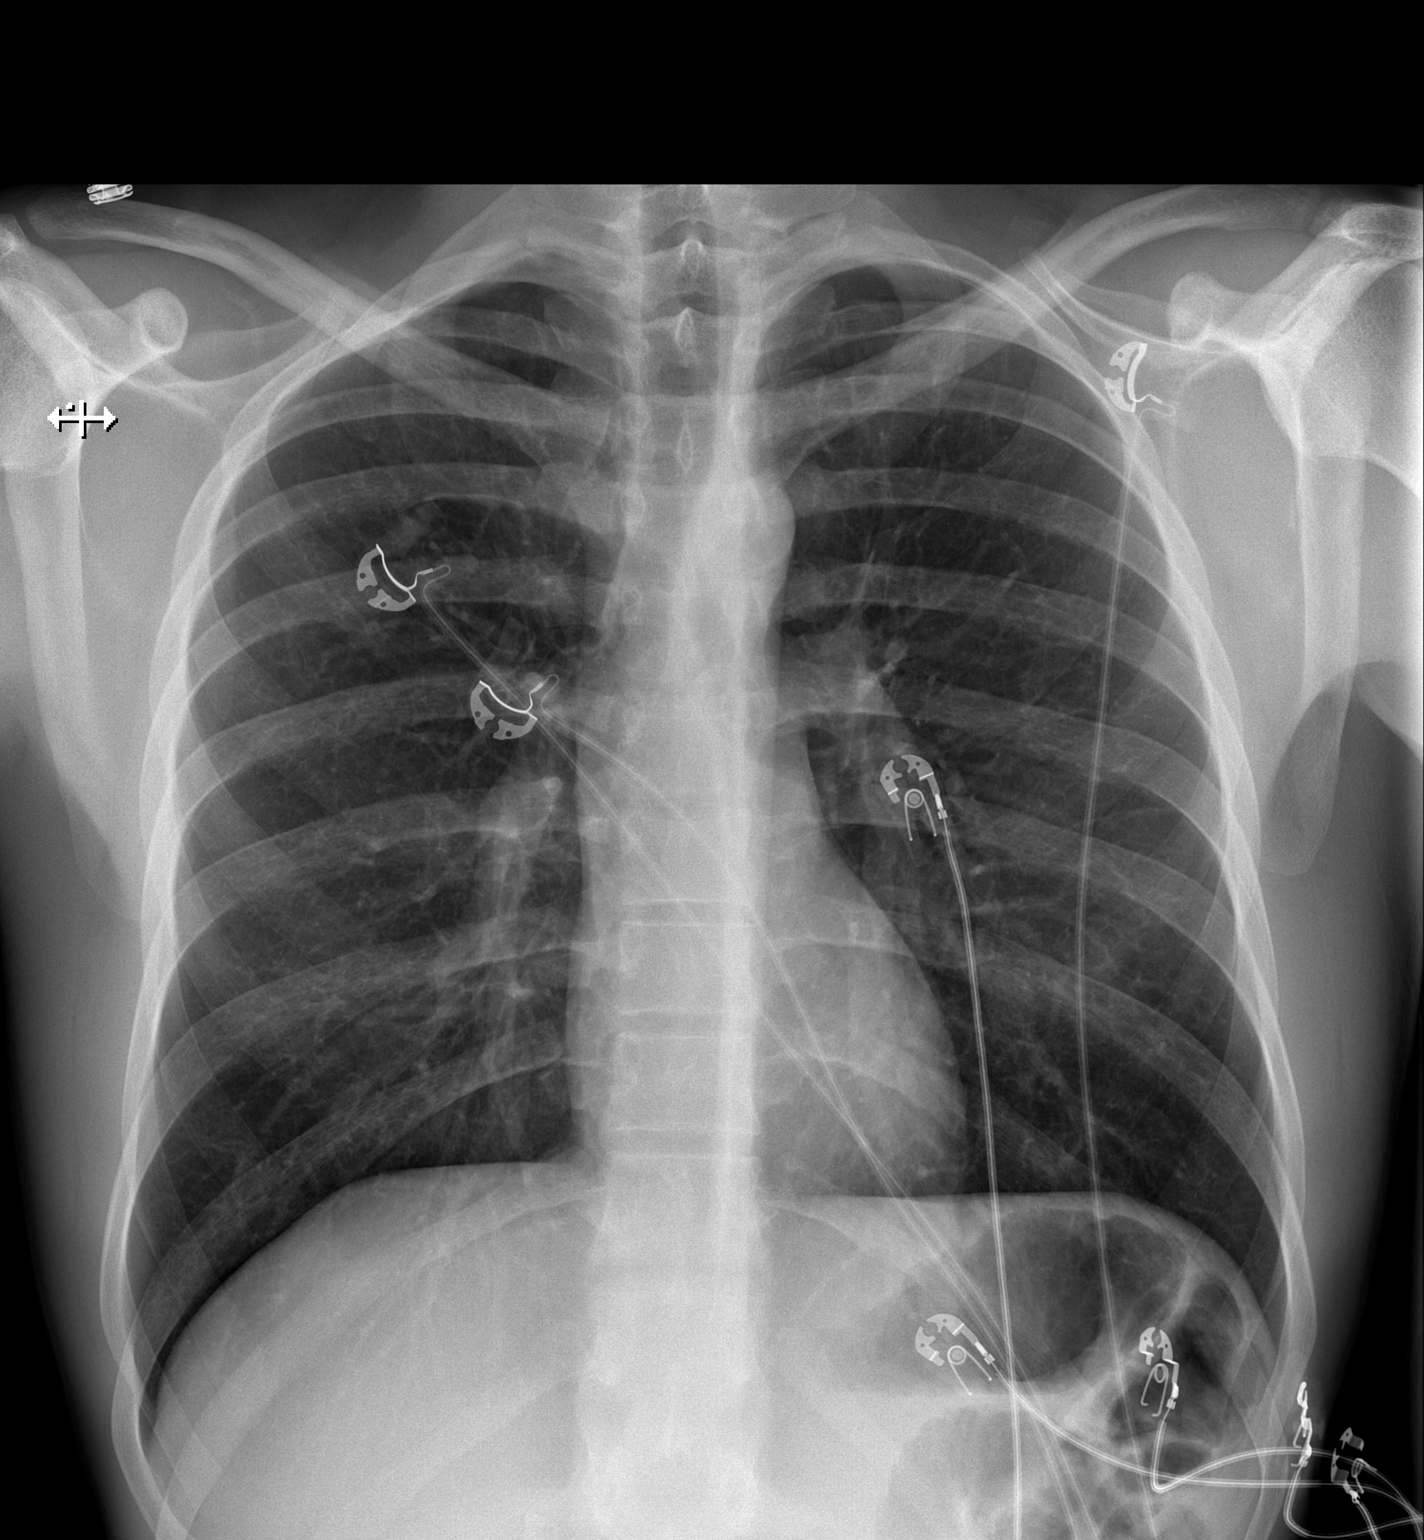

[w chest lat]
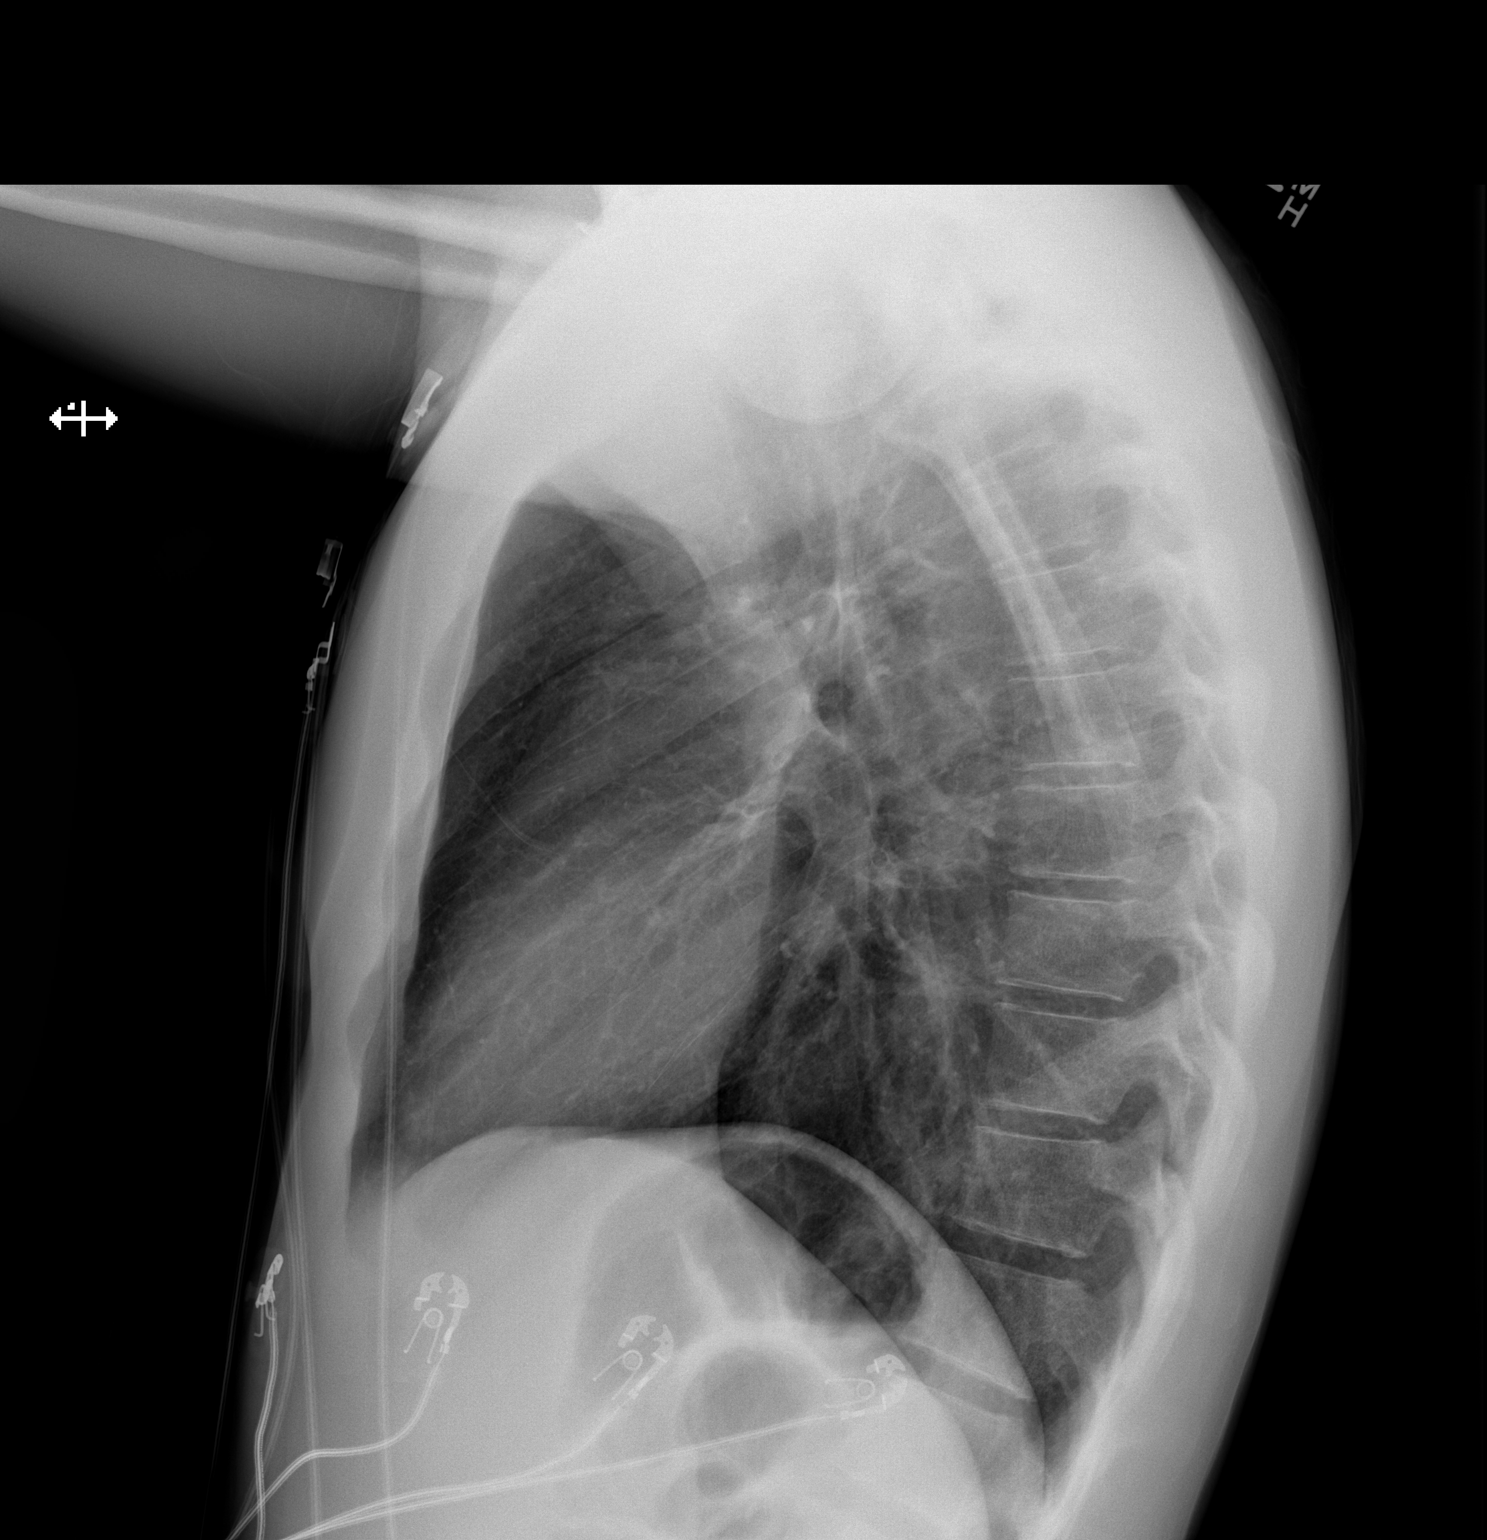

[2 of 2 positions shown; findings below may reference images not displayed]

FINDINGS: The heart size and mediastinal contours are within normal limits.
Both lungs are clear. The visualized skeletal structures are
unremarkable.
IMPRESSION: No active cardiopulmonary disease.

## 2015-12-24 IMAGING — CR DG CHEST 1V PORT
1 series · 2 of 2 positions shown · non-contrast
Comparison: Radiograph 07/29/2014

CLINICAL DATA: Vomiting blood.  Lethargic

EXAM:
PORTABLE CHEST - 1 VIEW

[Series 1: AP · U · 2 of 2 slices shown]
[im 1/2]
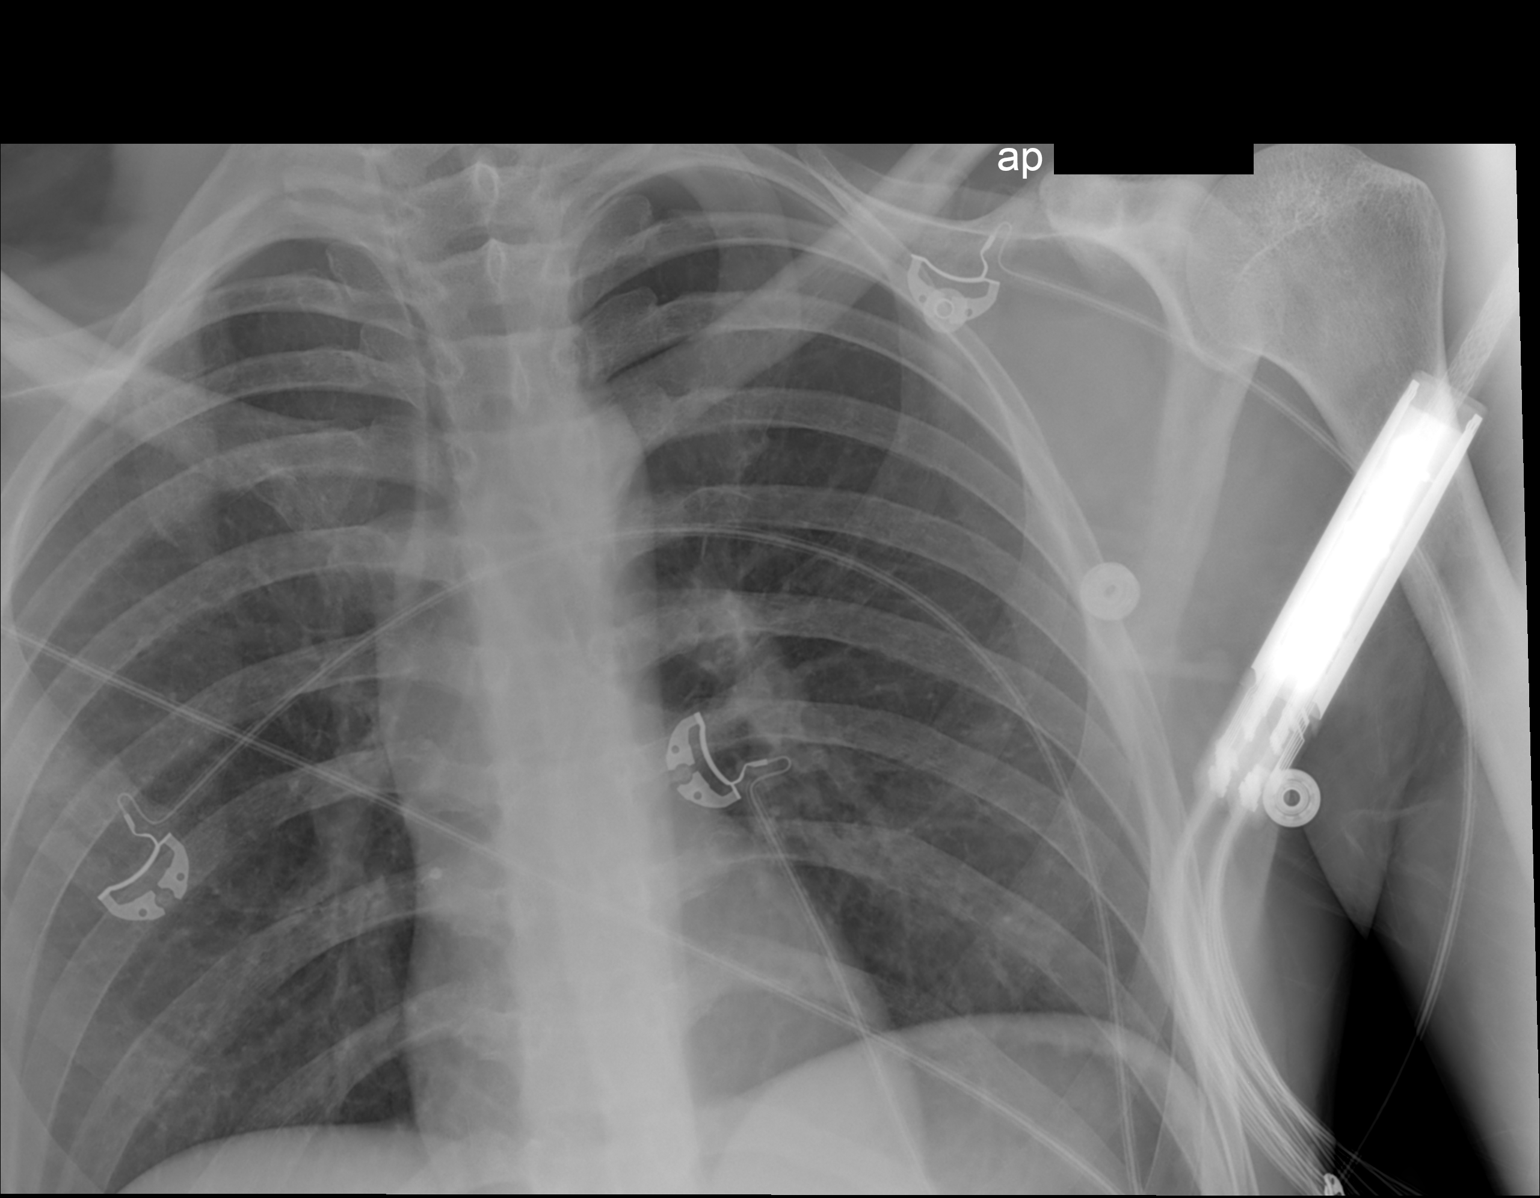
[im 2/2]
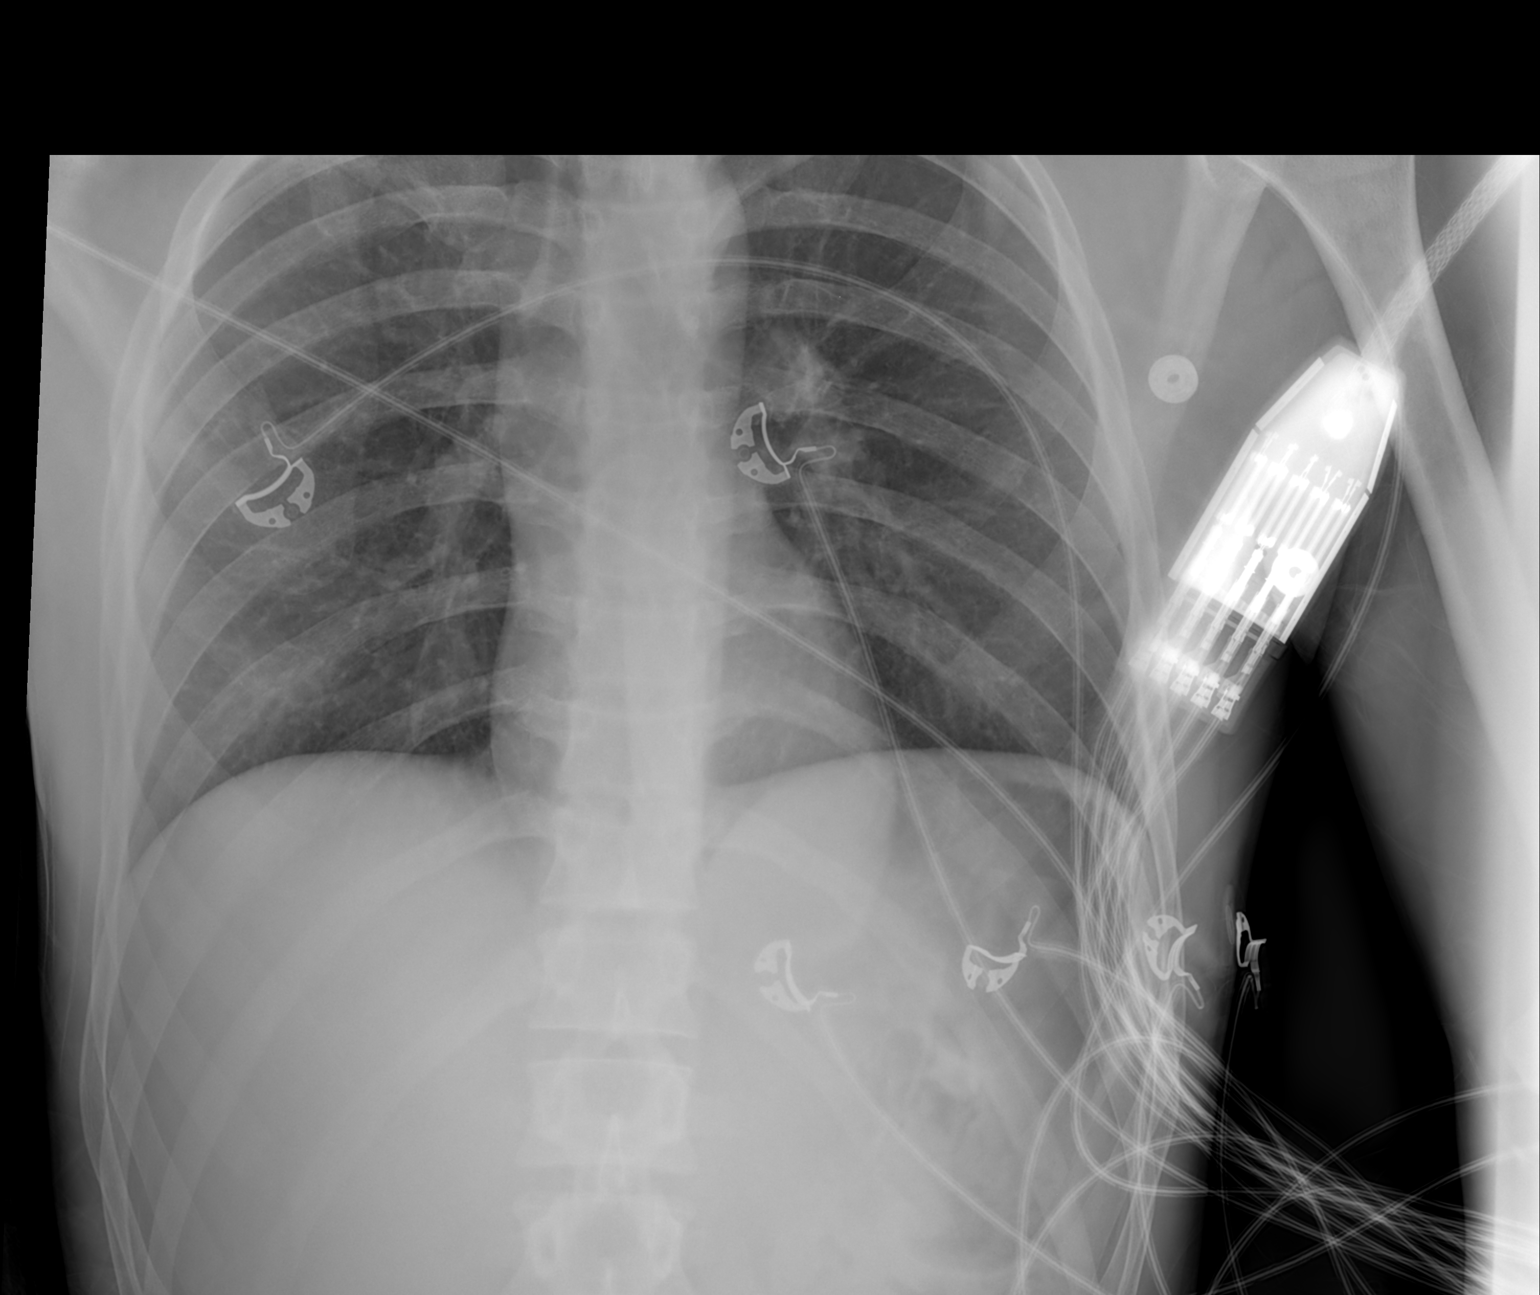

[2 of 2 positions shown; findings below may reference images not displayed]

FINDINGS: Normal mediastinum and cardiac silhouette. Normal pulmonary
vasculature. No evidence of effusion, infiltrate, or pneumothorax.
No acute bony abnormality.
IMPRESSION: No acute cardiopulmonary process.

## 2020-11-28 DEATH — deceased
